# Patient Record
Sex: Male | Born: 1970
Health system: Southern US, Community
[De-identification: ages and names within clinical notes are randomized; demographics above are authoritative.]

## PROBLEM LIST (undated history)

## (undated) DIAGNOSIS — R001 Bradycardia, unspecified: Secondary | ICD-10-CM

## (undated) DIAGNOSIS — I471 Supraventricular tachycardia, unspecified: Secondary | ICD-10-CM

## (undated) DIAGNOSIS — R002 Palpitations: Secondary | ICD-10-CM

## (undated) DIAGNOSIS — C8590 Non-Hodgkin lymphoma, unspecified, unspecified site: Secondary | ICD-10-CM

## (undated) DIAGNOSIS — E7800 Pure hypercholesterolemia, unspecified: Secondary | ICD-10-CM

## (undated) DIAGNOSIS — I10 Essential (primary) hypertension: Secondary | ICD-10-CM

## (undated) HISTORY — DX: Pure hypercholesterolemia, unspecified: E78.00

## (undated) HISTORY — DX: Palpitations: R00.2

## (undated) HISTORY — PX: KNEE SURGERY: SHX244

## (undated) HISTORY — DX: Bradycardia, unspecified: R00.1

## (undated) HISTORY — PX: SHOULDER SURGERY: SHX246

## (undated) HISTORY — PX: DG GALL BLADDER: HXRAD326

## (undated) HISTORY — DX: Essential (primary) hypertension: I10

---

## 2015-07-20 ENCOUNTER — Encounter: Payer: Self-pay | Admitting: Cardiovascular Disease

## 2015-07-20 ENCOUNTER — Ambulatory Visit (INDEPENDENT_AMBULATORY_CARE_PROVIDER_SITE_OTHER): Payer: 59 | Admitting: Cardiovascular Disease

## 2015-07-20 VITALS — BP 136/84 | HR 56 | Ht 68.0 in | Wt 188.8 lb

## 2015-07-20 DIAGNOSIS — R002 Palpitations: Secondary | ICD-10-CM | POA: Diagnosis not present

## 2015-07-20 DIAGNOSIS — R001 Bradycardia, unspecified: Secondary | ICD-10-CM | POA: Diagnosis not present

## 2015-07-20 DIAGNOSIS — R079 Chest pain, unspecified: Secondary | ICD-10-CM | POA: Diagnosis not present

## 2015-07-20 HISTORY — DX: Bradycardia, unspecified: R00.1

## 2015-07-20 HISTORY — DX: Palpitations: R00.2

## 2015-07-20 MED ORDER — LOVASTATIN 20 MG PO TABS: 20.0000 mg | ORAL_TABLET | Freq: Every day | ORAL | Status: DC

## 2015-07-20 NOTE — Progress Notes (Signed)
Cardiology Office Note   Date:  07/20/2015   ID:  Alex Mendoza, DOB October 12, 1970, MRN [id number]  PCP:  Orpah Melter, MD  Cardiologist:   Skeet Latch, MD   Chief Complaint  Patient presents with  . New Evaluation    Self-Referred fro Dx: Irregular Heartbeat per Tama High, MD  pt states he went to ED in December for Arrhythmia--HR was 110, 2 episodes--has noticed irregular rhythm, fluctuating/erratic BP which is unusual; Anxiety; One night, took a full quinapril which he had done before, but this time it made his BP drop and he became very dizzy; no other Sx.     History of Present Illness: Alex Mendoza is a 45 y.o. male with hypertension and hyperlipidemia who presents for palpitations.  He reports two episodes of irregular heart rates.  The first occurred a few days after Christmas.  He went to the ED but the episode subsided prior to being seen.  It lasted for over an hour.  His heart felt irregular and the rate ranged between 100-115.   The second episode occurred one month ago and lasted for several hours.  His BP and heart rate were both elevated.  There was no associated lightheadedness or shortness of breath.  He decided to take an extra quinipril and then developed lightheadedness and near syncope.  Alex Mendoza does not drink much caffeine.  He did have a small amount of chocolate prior to the first episode.  He does not use over the counter cold or cough medication.    Alex Mendoza exercises on the elliptical regularly since 2010.  In general he does not have exertional chest pain.  A few months ago he tried to exercise more aggressively and noted some chest pain.  It subsided after he rested.  He has a healthy diet.  His cholesterol has been elevated in the past so he is managing it with diet, exercise, niacin and lovastatin.  He denies lower extremity edema, orthopnea or PND.  Alex Mendoza mother was diagnosed with atrial fibrillation in her 56s or 57s.      Past Medical History  Diagnosis Date  . Bradycardia 07/20/2015  . Palpitations 07/20/2015    No past surgical history on file.   Current Outpatient Prescriptions  Medication Sig Dispense Refill  . niacin 100 MG tablet Take 100 mg by mouth daily.    . quinapril (ACCUPRIL) 20 MG tablet Take 10 mg by mouth every morning.    . lovastatin (MEVACOR) 20 MG tablet Take 1 tablet (20 mg total) by mouth at bedtime.     No current facility-administered medications for this visit.    Allergies:   Sulfa antibiotics    Social History:  The patient  reports that he has never smoked. He does not have any smokeless tobacco history on file. He reports that he does not drink alcohol or use illicit drugs.   Family History:  The patient's family history includes Atrial fibrillation in his mother; Bradycardia in his father; Heart failure in his maternal grandmother and paternal grandmother.    ROS:  Please see the history of present illness.   Otherwise, review of systems are positive for none.   All other systems are reviewed and negative.    PHYSICAL EXAM: VS:  BP 136/84 mmHg  Pulse 56  Ht 5\' 8"  (1.727 m)  Wt 85.639 kg (188 lb 12.8 oz)  BMI 28.71 kg/m2 , BMI Body mass index is 28.71 kg/(m^2). GENERAL:  Well appearing HEENT:  Pupils equal round and reactive, fundi not visualized, oral mucosa unremarkable NECK:  No jugular venous distention, waveform within normal limits, carotid upstroke brisk and symmetric, no bruits, no thyromegaly LYMPHATICS:  No cervical adenopathy LUNGS:  Clear to auscultation bilaterally HEART:  Bradycardic.  Regular rhythm.  PMI not displaced or sustained,S1 and S2 within normal limits, no S3, no S4, no clicks, no rubs, no murmurs ABD:  Flat, positive bowel sounds normal in frequency in pitch, no bruits, no rebound, no guarding, no midline pulsatile mass, no hepatomegaly, no splenomegaly EXT:  2 plus pulses throughout, no edema, no cyanosis no clubbing SKIN:  No  rashes no nodules NEURO:  Cranial nerves II through XII grossly intact, motor grossly intact throughout PSYCH:  Cognitively intact, oriented to person place and time   EKG:  EKG is ordered today. The ekg ordered today demonstrates Sinus bradycardia. Rate 56 bpm.   Recent Labs: No results found for requested labs within last 365 days.    Lipid Panel No results found for: CHOL, TRIG, HDL, CHOLHDL, VLDL, LDLCALC, LDLDIRECT    Wt Readings from Last 3 Encounters:  07/20/15 85.639 kg (188 lb 12.8 oz)      ASSESSMENT AND PLAN:  # Palpitations:  Mr. Orders had two episode of sustained arrhythmia.  His description is concerning for atrial fibrillation.  Given that the episodes are very intermittent, we are unlikely to catch it on an ambulatory monitor.  I asked him to call us if it recurs and hopefully we can catch it on a monitor or EKG.  If it were atrial fibrillation, his This patients CHA2DS2-VASc Score would be 1.  Therefore, we will not consider starting anticoagulation.  His bradycardia prohibits the use of nodal agents.  He already had the appropriate laboratory testing which was unremarkable.   # Chest pain: Alex Mendoza had exertional chest pain when he tried to increase his exercise regimen.  In general he is in very good shape.  However, it is possible that this represents ischemia.  We will obtain an ETT to better evaluate.   Current medicines are reviewed at length with the patient today.  The patient does not have concerns regarding medicines.  The following changes have been made:  no change  Labs/ tests ordered today include:   Orders Placed This Encounter  Procedures  . Exercise Tolerance Test  . EKG 12-Lead    Disposition:   FU with Madilyn Cephas C. Oval Linsey, MD, [facility] as needed.   This note was written with the assistance of speech recognition software.  Please excuse any transcriptional errors.  Signed, Cindi Ghazarian C. Oval Linsey, MD, [facility]  07/20/2015 10:25 PM    [place]

## 2015-07-20 NOTE — Patient Instructions (Signed)
Medication Instructions:  Your physician recommends that you continue on your current medications as directed. Please refer to the Current Medication list given to you today.   Labwork: none  Testing/Procedures: Your physician has requested that you have an exercise tolerance test. For further information please visit www.cardiosmart.org. Please also follow instruction sheet, as given.     Follow-Up: As needed.       If you need a refill on your cardiac medications before your next appointment, please call your pharmacy.   

## 2015-08-18 ENCOUNTER — Telehealth (HOSPITAL_COMMUNITY): Payer: Self-pay

## 2015-08-18 NOTE — Telephone Encounter (Signed)
Encounter complete. 

## 2015-08-23 ENCOUNTER — Inpatient Hospital Stay (HOSPITAL_COMMUNITY): Admission: RE | Admit: 2015-08-23 | Payer: 59 | Source: Ambulatory Visit

## 2015-08-25 ENCOUNTER — Telehealth (HOSPITAL_COMMUNITY): Payer: Self-pay

## 2015-08-25 NOTE — Telephone Encounter (Signed)
Encounter complete. 

## 2015-08-30 ENCOUNTER — Ambulatory Visit (HOSPITAL_COMMUNITY)
Admission: RE | Admit: 2015-08-30 | Discharge: 2015-08-30 | Disposition: A | Discharge status: Home / Self Care | Payer: 59 | Source: Ambulatory Visit | Attending: Cardiovascular Disease | Admitting: Cardiovascular Disease

## 2015-08-30 DIAGNOSIS — R079 Chest pain, unspecified: Secondary | ICD-10-CM | POA: Insufficient documentation

## 2015-08-30 LAB — EXERCISE TOLERANCE TEST
CHL CUP MPHR: 174 {beats}/min
CHL CUP RESTING HR STRESS: 67 {beats}/min
CHL RATE OF PERCEIVED EXERTION: 17
CSEPEDS: 1 s
CSEPHR: 99 %
Estimated workload: 17.5 METS
Exercise duration (min): 15 min
Peak HR: 173 {beats}/min

## 2016-03-08 ENCOUNTER — Encounter (INDEPENDENT_AMBULATORY_CARE_PROVIDER_SITE_OTHER): Payer: Self-pay

## 2016-03-08 ENCOUNTER — Ambulatory Visit (INDEPENDENT_AMBULATORY_CARE_PROVIDER_SITE_OTHER): Payer: 59

## 2016-03-08 ENCOUNTER — Ambulatory Visit (INDEPENDENT_AMBULATORY_CARE_PROVIDER_SITE_OTHER): Payer: 59 | Admitting: Orthopaedic Surgery

## 2016-03-08 ENCOUNTER — Encounter (INDEPENDENT_AMBULATORY_CARE_PROVIDER_SITE_OTHER): Payer: Self-pay | Admitting: Orthopaedic Surgery

## 2016-03-08 DIAGNOSIS — M25522 Pain in left elbow: Secondary | ICD-10-CM | POA: Diagnosis not present

## 2016-03-08 NOTE — Progress Notes (Signed)
Office Visit Note   Patient: Alex Mendoza           Date of Birth: July 05, 1970           MRN: [id number] Visit Date: 03/08/2016              Requested by: Orpah Melter, MD [address] [place], [place] [address] PCP: Orpah Melter, MD   Assessment & Plan: Visit Diagnoses:  1. Pain in left elbow     Plan: From my standpoint patient is doing quite well from his radial head resection. I think his weakness is more related to statin use. I do think that from his injury and surgery he does have some partial permanent impairment that would render him slightly weak compared to his unaffected side but from what he is in dorsi and sounds more like it's related to statin use. I encouraged him to discuss with his primary care doctor about his statin use. I will see him back as needed.  Follow-Up Instructions: Return if symptoms worsen or fail to improve.   Orders:  Orders Placed This Encounter  Procedures  . XR Elbow 2 Views Left   No orders of the defined types were placed in this encounter.     Procedures: No procedures performed   Clinical Data: No additional findings.   Subjective: Chief Complaint  Patient presents with  . Left Elbow - Weakness    Patient states left radius fracture in 1985. Left forearm has always been weak. Pain and weakness increased recently after taking statin, patient has stopped taking statin and symptoms have decreased.    Patient is a 45 year old gentleman with nonspecific left arm and elbow weakness since 1985. He has a history of left radial head fracture that was treated operatively with radial head resection. He did notice that he had a form elbow weakness or he was taking statins and the weakness did improve once he came off of those medicines. He denies any focal motor or sensory deficits of his left upper extremity. He feels like some things or heavy when he lifts them. He denies any point tenderness at the elbow or radial head  region. He has good range of motion.    Review of Systems Complete review of systems is negative except for history of present illness  Objective: Vital Signs: There were no vitals taken for this visit.  Physical Exam Well-developed well-nourished no acute distress alert 3 nonlabored breathing normal judgments affect abdomen soft no lymphadenopathy Ortho Exam Exam of the left elbow shows a postsurgical scar that is well-healed. He has excellent range of motion in all planes. The radiocapitellar joint is nontender. Lateral epicondyles nontender. Negative cubital tunnel symptoms. Specialty Comments:  No specialty comments available.  Imaging: Xr Elbow 2 Views Left  Result Date: 03/08/2016 Status post radial head resection. No acute findings. Elbow joint is located.    PMFS History: Patient Active Problem List   Diagnosis Date Noted  . Pain in left elbow 03/08/2016  . Bradycardia 07/20/2015  . Palpitations 07/20/2015   Past Medical History:  Diagnosis Date  . Bradycardia 07/20/2015  . Palpitations 07/20/2015    Family History  Problem Relation Age of Onset  . Atrial fibrillation Mother   . Heart failure Maternal Grandmother   . Heart failure Paternal Grandmother   . Bradycardia Father     History reviewed. No pertinent surgical history. Social History   Occupational History  . Not on file.   Social  History Main Topics  . Smoking status: Never Smoker  . Smokeless tobacco: Not on file  . Alcohol use No  . Drug use: No  . Sexual activity: Not on file

## 2016-03-18 DIAGNOSIS — R51 Headache: Secondary | ICD-10-CM | POA: Diagnosis not present

## 2016-03-18 DIAGNOSIS — I10 Essential (primary) hypertension: Secondary | ICD-10-CM | POA: Diagnosis not present

## 2016-03-18 DIAGNOSIS — E7800 Pure hypercholesterolemia, unspecified: Secondary | ICD-10-CM | POA: Diagnosis not present

## 2016-04-23 ENCOUNTER — Ambulatory Visit (INDEPENDENT_AMBULATORY_CARE_PROVIDER_SITE_OTHER): Payer: 59 | Admitting: Diagnostic Neuroimaging

## 2016-04-23 ENCOUNTER — Encounter: Payer: Self-pay | Admitting: Diagnostic Neuroimaging

## 2016-04-23 VITALS — BP 128/85 | HR 68 | Ht 67.0 in | Wt 194.2 lb

## 2016-04-23 DIAGNOSIS — R51 Headache: Secondary | ICD-10-CM

## 2016-04-23 DIAGNOSIS — R519 Headache, unspecified: Secondary | ICD-10-CM

## 2016-04-23 NOTE — Progress Notes (Signed)
[facility]  PATIENT: Alex Mendoza DOB: 02-May-1970  REFERRING CLINICIAN: Olen Pel, S HISTORY FROM: patient  REASON FOR VISIT: new consult    HISTORICAL  CHIEF COMPLAINT:  Chief Complaint  Patient presents with  . Chronic headaches    rm 7, New Pt, "headaches off and on, may be related to anxiety/stress; worse in past 2 wks; numbness in face/forehead; feeling of fatigue in my brain; ASA as needed w/varied relief"    HISTORY OF PRESENT ILLNESS:   46 year old male here for evaluation of headaches.  Patient reports at least 6 months of increasing frontal, tension, pulling headaches. He has had intermittent nausea and sensitivity to light with these headaches. Headaches have been particularly worse in the past few weeks and occurring almost on a daily basis. Sometimes she has problems with focus and attention during these headaches. He feels something is "wrong" in his face or head. He has looked up symptoms online is concerned about a variety of neurologic problem such as multiple sclerosis.  Patient has history of headaches as a child with unilateral or bilateral severe pounding headaches, with nausea and photophobia. He describes these as "migraine" headaches. He was never officially diagnosed. He had missed school several times when he was younger due to these headaches. No specific triggering factors at that time. Headaches can last hours at a time.   Patient has been having more stress and tension at work. Patient has noticed that dark chocolate and certain types of potato chips can trigger headaches.  No family history of migraine or headaches.  Patient does have history of anxiety and tension, but has not been on medication.   REVIEW OF SYSTEMS: Full 14 system review of systems performed and negative with exception of: Fatigue snoring headache numbness weakness dizziness anxiety.  ALLERGIES: Allergies  Allergen Reactions  . Sulfa Antibiotics Hives  and Rash    HOME MEDICATIONS: Outpatient Medications Prior to Visit  Medication Sig Dispense Refill  . quinapril (ACCUPRIL) 20 MG tablet Take 10 mg by mouth every morning.    . lovastatin (MEVACOR) 20 MG tablet Take 1 tablet (20 mg total) by mouth at bedtime.    . niacin 100 MG tablet Take 100 mg by mouth daily.     No facility-administered medications prior to visit.     PAST MEDICAL HISTORY: Past Medical History:  Diagnosis Date  . Bradycardia 07/20/2015  . Hypercholesteremia   . Hypertension   . Palpitations 07/20/2015    PAST SURGICAL HISTORY: Past Surgical History:  Procedure Laterality Date  . DG GALL BLADDER    . KNEE SURGERY Right   . SHOULDER SURGERY Left     FAMILY HISTORY: Family History  Problem Relation Age of Onset  . Atrial fibrillation Mother   . Heart failure Maternal Grandmother   . Heart failure Paternal Grandmother   . Bradycardia Father   . Cancer Paternal Aunt     SOCIAL HISTORY:  Social History   Social History  . Marital status: Married    Spouse name: Margaretha Sheffield  . Number of children: 2  . Years of education: 16   Occupational History  .      [employer]   Social History Main Topics  . Smoking status: Never Smoker  . Smokeless tobacco: Never Used  . Alcohol use No  . Drug use: No  . Sexual activity: Not on file   Other Topics Concern  . Not on file   Social History Narrative   Lives with wife,  family   Caffeine- some chocolate     PHYSICAL EXAM  GENERAL EXAM/CONSTITUTIONAL: Vitals:  Vitals:   04/23/16 1251  BP: 128/85  Pulse: 68  Weight: 194 lb 3.2 oz (88.1 kg)  Height: 5\' 7"  (1.702 m)     Body mass index is 30.42 kg/m.  Visual Acuity Screening   Right eye Left eye Both eyes  Without correction:     With correction: 20/30 20/20      Patient is in no distress; well developed, nourished and groomed; neck is supple  CARDIOVASCULAR:  Examination of carotid arteries is normal; no carotid bruits  Regular rate and  rhythm, no murmurs  Examination of peripheral vascular system by observation and palpation is normal  EYES:  Ophthalmoscopic exam of optic discs and posterior segments is normal; no papilledema or hemorrhages  MUSCULOSKELETAL:  Gait, strength, tone, movements noted in Neurologic exam below  NEUROLOGIC: MENTAL STATUS:  No flowsheet data found.  awake, alert, oriented to person, place and time  recent and remote memory intact  normal attention and concentration  language fluent, comprehension intact, naming intact,   fund of knowledge appropriate  CRANIAL NERVE:   2nd - no papilledema on fundoscopic exam  2nd, 3rd, 4th, 6th - pupils equal and reactive to light, visual fields full to confrontation, extraocular muscles intact, no nystagmus  5th - facial sensation symmetric  7th - facial strength symmetric  8th - hearing intact  9th - palate elevates symmetrically, uvula midline  11th - shoulder shrug symmetric  12th - tongue protrusion midline  MOTOR:   normal bulk and tone, full strength in the BUE, BLE  SENSORY:   normal and symmetric to light touch, pinprick, temperature, vibration  COORDINATION:   finger-nose-finger, fine finger movements normal  REFLEXES:   deep tendon reflexes present and symmetric  GAIT/STATION:   narrow based gait; able to walk on toes, heels and tandem; romberg is negative    DIAGNOSTIC DATA (LABS, IMAGING, TESTING) - I reviewed patient records, labs, notes, testing and imaging myself where available.  No results found for: WBC, HGB, HCT, MCV, PLT No results found for: NA, K, CL, CO2, GLUCOSE, BUN, CREATININE, CALCIUM, PROT, ALBUMIN, AST, ALT, ALKPHOS, BILITOT, GFRNONAA, GFRAA No results found for: CHOL, HDL, LDLCALC, LDLDIRECT, TRIG, CHOLHDL No results found for: HGBA1C No results found for: VITAMINB12 No results found for: TSH      ASSESSMENT AND PLAN  46 y.o. year old male here with history of migraine  headaches since childhood, now with new type of headache in the past 6 months, especially worse in the past few weeks. Patient also under significant stress and tension related to his work. Also has history of anxiety. We'll check MRI of the brain to rule out any secondary cause due to severe and new onset of headache.   Dx: migraine + tension HA; vs secondary headache   1. New onset headache      PLAN: - check MRI brain (rule out mass, infx, inflammation) - consider amitriptyline for migraine prevention in future  Orders Placed This Encounter  Procedures  . MR BRAIN W WO CONTRAST   Return in about 6 weeks (around 06/04/2016).  I reviewed images, labs, notes, records myself. I summarized findings and reviewed with patient, for this high risk condition (new onset headache) requiring high complexity decision making.     Penni Bombard, MD [id number], 0000000 PM Certified in Neurology, Neurophysiology and Neuroimaging  [facility] [address]  [place], [place] [address] [phone]

## 2016-04-23 NOTE — Patient Instructions (Signed)
Thank you for coming to see [place] at [facility]. I hope we have been able to provide you high quality care today.  You may receive a patient satisfaction survey over the next few weeks. We would appreciate your feedback and comments so that we may continue to improve ourselves and the health of our patients.  - I will check MRI brain   - To prevent or relieve headaches, try the following:   Cool Compress. Lie down and place a cool compress on your head.   Avoid headache triggers. If certain foods or odors seem to have triggered your migraines in the past, avoid them. A headache diary might help you identify triggers.   Include physical activity in your daily routine.   Manage stress. Find healthy ways to cope with the stressors, such as delegating tasks on your to-do list.   Practice relaxation techniques. Try deep breathing, yoga, massage and visualization.   Eat regularly. Eating regularly scheduled meals and maintaining a healthy diet might help prevent headaches. Also, drink plenty of fluids.   Follow a regular sleep schedule. Sleep deprivation might contribute to headaches  Consider biofeedback. With this mind-body technique, you learn to control certain bodily functions - such as muscle tension, heart rate and blood pressure - to prevent headaches or reduce headache pain.    ~~~~~~~~~~~~~~~~~~~~~~~~~~~~~~~~~~~~~~~~~~~~~~~~~~~~~~~~~~~~~~~~~  DR. Yamileth Hayse'S GUIDE TO HAPPY AND HEALTHY LIVING These are some of my general health and wellness recommendations. Some of them may apply to you better than others. Please use common sense as you try these suggestions and feel free to ask me any questions.   ACTIVITY/FITNESS Mental, social, emotional and physical stimulation are very important for brain and body health. Try learning a new activity (arts, music, language, sports, games).  Keep moving your body to the best of your abilities. You can do this at home,  inside or outside, the park, community center, gym or anywhere you like. Consider a physical therapist or personal trainer to get started. Consider the app Sworkit. Fitness trackers such as smart-watches, smart-phones or Fitbits can help as well.   NUTRITION Eat more plants: colorful vegetables, nuts, seeds and berries.  Eat less sugar, salt, preservatives and processed foods.  Avoid toxins such as cigarettes and alcohol.  Drink water when you are thirsty. Warm water with a slice of lemon is an excellent morning drink to start the day.  Consider these websites for more information The Nutrition Source (https://www.henry-hernandez.biz/) Precision Nutrition (WindowBlog.ch)   RELAXATION Consider practicing mindfulness meditation or other relaxation techniques such as deep breathing, prayer, yoga, tai chi, massage. See website mindful.org or the apps Headspace or Calm to help get started.   SLEEP Try to get at least 7-8+ hours sleep per day. Regular exercise and reduced caffeine will help you sleep better. Practice good sleep hygeine techniques. See website sleep.org for more information.   PLANNING Prepare estate planning, living will, healthcare POA documents. Sometimes this is best planned with the help of an attorney. Theconversationproject.org and agingwithdignity.org are excellent resources.

## 2016-04-24 ENCOUNTER — Other Ambulatory Visit: Payer: Self-pay | Admitting: Diagnostic Neuroimaging

## 2016-04-24 DIAGNOSIS — R51 Headache: Principal | ICD-10-CM

## 2016-04-24 DIAGNOSIS — R519 Headache, unspecified: Secondary | ICD-10-CM

## 2016-04-27 ENCOUNTER — Other Ambulatory Visit (HOSPITAL_BASED_OUTPATIENT_CLINIC_OR_DEPARTMENT_OTHER): Payer: 59

## 2016-05-01 ENCOUNTER — Ambulatory Visit (INDEPENDENT_AMBULATORY_CARE_PROVIDER_SITE_OTHER): Payer: 59

## 2016-05-01 DIAGNOSIS — R51 Headache: Secondary | ICD-10-CM

## 2016-05-01 DIAGNOSIS — R519 Headache, unspecified: Secondary | ICD-10-CM

## 2016-05-01 MED ORDER — GADOPENTETATE DIMEGLUMINE 469.01 MG/ML IV SOLN: 18.0000 mL | Freq: Once | INTRAVENOUS | Status: AC | PRN

## 2016-05-03 ENCOUNTER — Telehealth: Payer: Self-pay | Admitting: *Deleted

## 2016-05-03 NOTE — Telephone Encounter (Signed)
Per Dr Leta Baptist, spoke with  Patient and advised him his MRI brain results are unremarkable. Advised he monitor his headaches, consider migraine prevention medication, call prior to FU for any needs, concerns. He verbalized understanding, appreciation.

## 2016-05-07 ENCOUNTER — Ambulatory Visit: Payer: 59 | Admitting: Diagnostic Neuroimaging

## 2016-05-15 ENCOUNTER — Telehealth: Payer: Self-pay | Admitting: Diagnostic Neuroimaging

## 2016-05-15 NOTE — Telephone Encounter (Signed)
Pt chose to cx 4/2 and not r/s another appt. Pt states he is not interested in r/s at this time.

## 2016-05-15 NOTE — Telephone Encounter (Signed)
Noted  

## 2016-06-10 ENCOUNTER — Ambulatory Visit: Payer: 59 | Admitting: Diagnostic Neuroimaging

## 2016-12-23 DIAGNOSIS — M9901 Segmental and somatic dysfunction of cervical region: Secondary | ICD-10-CM | POA: Diagnosis not present

## 2016-12-23 DIAGNOSIS — M5033 Other cervical disc degeneration, cervicothoracic region: Secondary | ICD-10-CM | POA: Diagnosis not present

## 2016-12-23 DIAGNOSIS — M531 Cervicobrachial syndrome: Secondary | ICD-10-CM | POA: Diagnosis not present

## 2016-12-24 DIAGNOSIS — M5033 Other cervical disc degeneration, cervicothoracic region: Secondary | ICD-10-CM | POA: Diagnosis not present

## 2016-12-24 DIAGNOSIS — M531 Cervicobrachial syndrome: Secondary | ICD-10-CM | POA: Diagnosis not present

## 2016-12-24 DIAGNOSIS — M9901 Segmental and somatic dysfunction of cervical region: Secondary | ICD-10-CM | POA: Diagnosis not present

## 2016-12-27 DIAGNOSIS — M9901 Segmental and somatic dysfunction of cervical region: Secondary | ICD-10-CM | POA: Diagnosis not present

## 2016-12-27 DIAGNOSIS — M531 Cervicobrachial syndrome: Secondary | ICD-10-CM | POA: Diagnosis not present

## 2016-12-27 DIAGNOSIS — M5033 Other cervical disc degeneration, cervicothoracic region: Secondary | ICD-10-CM | POA: Diagnosis not present

## 2017-01-23 DIAGNOSIS — Z131 Encounter for screening for diabetes mellitus: Secondary | ICD-10-CM | POA: Diagnosis not present

## 2017-01-29 DIAGNOSIS — M9901 Segmental and somatic dysfunction of cervical region: Secondary | ICD-10-CM | POA: Diagnosis not present

## 2017-01-29 DIAGNOSIS — M5033 Other cervical disc degeneration, cervicothoracic region: Secondary | ICD-10-CM | POA: Diagnosis not present

## 2017-01-29 DIAGNOSIS — M531 Cervicobrachial syndrome: Secondary | ICD-10-CM | POA: Diagnosis not present

## 2017-10-29 DIAGNOSIS — Z0000 Encounter for general adult medical examination without abnormal findings: Secondary | ICD-10-CM | POA: Diagnosis not present

## 2017-10-30 ENCOUNTER — Other Ambulatory Visit: Payer: Self-pay | Admitting: Adult Health Nurse Practitioner

## 2017-10-30 DIAGNOSIS — N6320 Unspecified lump in the left breast, unspecified quadrant: Secondary | ICD-10-CM

## 2017-10-31 ENCOUNTER — Ambulatory Visit
Admission: RE | Admit: 2017-10-31 | Discharge: 2017-10-31 | Disposition: A | Discharge status: Home / Self Care | Payer: 59 | Source: Ambulatory Visit | Attending: Adult Health Nurse Practitioner | Admitting: Adult Health Nurse Practitioner

## 2017-10-31 ENCOUNTER — Other Ambulatory Visit: Payer: Self-pay | Admitting: Adult Health Nurse Practitioner

## 2017-10-31 DIAGNOSIS — R590 Localized enlarged lymph nodes: Secondary | ICD-10-CM | POA: Diagnosis not present

## 2017-10-31 DIAGNOSIS — N6321 Unspecified lump in the left breast, upper outer quadrant: Secondary | ICD-10-CM | POA: Diagnosis not present

## 2017-10-31 DIAGNOSIS — R599 Enlarged lymph nodes, unspecified: Secondary | ICD-10-CM

## 2017-10-31 DIAGNOSIS — N6320 Unspecified lump in the left breast, unspecified quadrant: Secondary | ICD-10-CM

## 2017-10-31 DIAGNOSIS — C8334 Diffuse large B-cell lymphoma, lymph nodes of axilla and upper limb: Secondary | ICD-10-CM | POA: Diagnosis not present

## 2017-10-31 DIAGNOSIS — R928 Other abnormal and inconclusive findings on diagnostic imaging of breast: Secondary | ICD-10-CM | POA: Diagnosis not present

## 2017-10-31 DIAGNOSIS — C8339 Diffuse large B-cell lymphoma, extranodal and solid organ sites: Secondary | ICD-10-CM | POA: Diagnosis not present

## 2017-11-07 ENCOUNTER — Telehealth: Payer: Self-pay | Admitting: Hematology

## 2017-11-07 NOTE — Telephone Encounter (Signed)
Pt has been scheduled to see Dr. Irene Limbo on 9/4 at 340pm. Pt aware to arrive 30 minutes early. Pt has requested to have a 2nd opinion with Dr. Arelia Sneddon as well. Will notify the [place] location. Letter mailed to the pt.

## 2017-11-12 ENCOUNTER — Inpatient Hospital Stay: Payer: 59 | Attending: Hematology | Admitting: Hematology

## 2017-11-12 ENCOUNTER — Encounter: Payer: Self-pay | Admitting: Hematology

## 2017-11-12 VITALS — BP 135/96 | HR 75 | Temp 98.1°F | Resp 18 | Ht 67.0 in | Wt 193.8 lb

## 2017-11-12 DIAGNOSIS — Z79899 Other long term (current) drug therapy: Secondary | ICD-10-CM

## 2017-11-12 DIAGNOSIS — R600 Localized edema: Secondary | ICD-10-CM

## 2017-11-12 DIAGNOSIS — I10 Essential (primary) hypertension: Secondary | ICD-10-CM | POA: Diagnosis not present

## 2017-11-12 DIAGNOSIS — C8338 Diffuse large B-cell lymphoma, lymph nodes of multiple sites: Secondary | ICD-10-CM | POA: Diagnosis present

## 2017-11-12 DIAGNOSIS — Z5112 Encounter for antineoplastic immunotherapy: Secondary | ICD-10-CM | POA: Diagnosis not present

## 2017-11-12 DIAGNOSIS — R200 Anesthesia of skin: Secondary | ICD-10-CM | POA: Diagnosis not present

## 2017-11-12 DIAGNOSIS — D3501 Benign neoplasm of right adrenal gland: Secondary | ICD-10-CM | POA: Diagnosis not present

## 2017-11-12 DIAGNOSIS — Z23 Encounter for immunization: Secondary | ICD-10-CM | POA: Diagnosis not present

## 2017-11-12 DIAGNOSIS — F419 Anxiety disorder, unspecified: Secondary | ICD-10-CM | POA: Diagnosis not present

## 2017-11-12 DIAGNOSIS — E7800 Pure hypercholesterolemia, unspecified: Secondary | ICD-10-CM | POA: Diagnosis not present

## 2017-11-12 DIAGNOSIS — Z5111 Encounter for antineoplastic chemotherapy: Secondary | ICD-10-CM | POA: Insufficient documentation

## 2017-11-12 DIAGNOSIS — R51 Headache: Secondary | ICD-10-CM | POA: Diagnosis not present

## 2017-11-12 DIAGNOSIS — R222 Localized swelling, mass and lump, trunk: Secondary | ICD-10-CM

## 2017-11-12 NOTE — Progress Notes (Signed)
HEMATOLOGY/ONCOLOGY CONSULTATION NOTE  Date of Service: 11/12/2017  Patient Care Team: Orpah Melter, MD as PCP - General (Family Medicine)  CHIEF COMPLAINTS/PURPOSE OF CONSULTATION:  Newly diagnosed Diffuse Large B-Cell Lymphoma   HISTORY OF PRESENTING ILLNESS:   Alex Mendoza is a wonderful 47 y.o. male who has been referred to [place] by Alesia Morin, NP for evaluation and management of Diffuse Large B-Cell Lymphoma. He is accompanied today by his wife. The pt reports that he is doing well overall.   The pt presented to care with his PCP Alesia Morin on 10/29/17 complaining of a swollen left armpit and a lump on left pectoral area that had not changed in 12 months. He first noted the left armpit 3 weeks ago, which he notes is growing quickly.   The pt reports that he occasionally gets headaches that present with some numbness and other feelings that he notes are difficult to describe. He notes that these headaches have worsened over the last 6 months and have caused him to miss work. He notes that these headaches seem to concentrate in the center of his forehead and he has needed to use up to 4-6 aspirin. He notes that his headaches have been evaluated in January 2018 with a MRI Brain which was normal.   The pt notes that he did have a deer tick bite in July 2017 and received prophylactic antibiotics.   The pt notes that he stays very active and eats healthy in general. He notes that he has anxiety and tries to mitigate his anxiety with cardio and staying active. He also recently began Buspirone and notes that he has been sleeping better in the last week.   Of note prior to the patient's visit today, pt has had Left breast nodule and Left axillary biopsies completed on 10/31/17 with results revealing Diffuse Large B-Cell Lymphoma.   Most recent lab results (10/29/17) of CBC is as follows: all values are WNL except for ANC at 8.5k.   On review of systems, pt reports staying active,  worsened headaches over 6 months, good energy levels, some anxiety, and denies skin rashes, drenching night sweats, fevers, chills, unexpected weight loss, abdominal pains, SOB, chest discomfort, leg swelling, joint pain and swelling, and any other symptoms.   On PMHx the pt reports borderline hypertension, hyperlipidemia, occasional somnambulism, choleycystectomy in 2007, left knee surgery. He denies sleep apnea.  On Social Hx the pt denies any ETOH consumption and denies ever smoking.  On Family Hx the pt reports paternal aunt with brain tumor, and denies blood or cancer disorders or CAD.  He reports sulfa antibiotic allergies with whole body rash.    MEDICAL HISTORY:  Past Medical History:  Diagnosis Date  . Bradycardia 07/20/2015  . Hypercholesteremia   . Hypertension   . Palpitations 07/20/2015    SURGICAL HISTORY: Past Surgical History:  Procedure Laterality Date  . DG GALL BLADDER    . KNEE SURGERY Right   . SHOULDER SURGERY Left     SOCIAL HISTORY: Social History   Socioeconomic History  . Marital status: Married    Spouse name: Margaretha Sheffield  . Number of children: 2  . Years of education: 68  . Highest education level: Not on file  Occupational History    Comment: [employer]  Social Needs  . Financial resource strain: Not on file  . Food insecurity:    Worry: Not on file    Inability: Not on file  . Transportation needs:  Medical: Not on file    Non-medical: Not on file  Tobacco Use  . Smoking status: Never Smoker  . Smokeless tobacco: Never Used  Substance and Sexual Activity  . Alcohol use: No    Alcohol/week: 0.0 standard drinks  . Drug use: No  . Sexual activity: Not on file  Lifestyle  . Physical activity:    Days per week: Not on file    Minutes per session: Not on file  . Stress: Not on file  Relationships  . Social connections:    Talks on phone: Not on file    Gets together: Not on file    Attends religious service: Not on file    Active member of  club or organization: Not on file    Attends meetings of clubs or organizations: Not on file    Relationship status: Not on file  . Intimate partner violence:    Fear of current or ex partner: Not on file    Emotionally abused: Not on file    Physically abused: Not on file    Forced sexual activity: Not on file  Other Topics Concern  . Not on file  Social History Narrative   Lives with wife, family   Caffeine- some chocolate    FAMILY HISTORY: Family History  Problem Relation Age of Onset  . Atrial fibrillation Mother   . Heart failure Maternal Grandmother   . Heart failure Paternal Grandmother   . Bradycardia Father   . Cancer Paternal Aunt     ALLERGIES:  is allergic to sulfa antibiotics.  MEDICATIONS:  Current Outpatient Medications  Medication Sig Dispense Refill  . busPIRone (BUSPAR) 5 MG tablet Take 5 mg by mouth 2 (two) times daily.    . rosuvastatin (CRESTOR) 10 MG tablet Take 10 mg by mouth daily.    . quinapril (ACCUPRIL) 20 MG tablet Take 10 mg by mouth every morning.     No current facility-administered medications for this visit.    Facility-Administered Medications Ordered in Other Visits  Medication Dose Route Frequency Provider Last Rate Last Dose  . gadopentetate dimeglumine (MAGNEVIST) injection 18 mL  18 mL Intravenous Once PRN Penumalli, Earlean Polka, MD        REVIEW OF SYSTEMS:    10 Point review of Systems was done is negative except as noted above.  PHYSICAL EXAMINATION: ECOG PERFORMANCE STATUS: 1 - Symptomatic but completely ambulatory  . Vitals:   11/12/17 1552  BP: (!) 135/96  Pulse: 75  Resp: 18  Temp: 98.1 F (36.7 C)  SpO2: 99%   Filed Weights   11/12/17 1552  Weight: 193 lb 12.8 oz (87.9 kg)   .Body mass index is 30.35 kg/m.  GENERAL:alert, in no acute distress and comfortable SKIN: no acute rashes, no significant lesions EYES: conjunctiva are pink and non-injected, sclera anicteric OROPHARYNX: MMM, no exudates, no  oropharyngeal erythema or ulceration NECK: supple, no JVD LYMPH:  4-5cm LN in left axilla, 2-3cm LN in left chest wall, no palpable lymphadenopathy in the cervical or inguinal regions LUNGS: clear to auscultation b/l with normal respiratory effort HEART: regular rate & rhythm ABDOMEN:  normoactive bowel sounds , non tender, not distended. Extremity: no pedal edema PSYCH: alert & oriented x 3 with fluent speech NEURO: no focal motor/sensory deficits  LABORATORY DATA:  I have reviewed the data as listed  .No flowsheet data found. Component     Latest Ref Rng & Units 11/14/2017  Sodium     135 -  145 mmol/L 142  Potassium     3.5 - 5.1 mmol/L 4.2  Chloride     98 - 111 mmol/L 108  CO2     22 - 32 mmol/L 24  Glucose     70 - 99 mg/dL 94  BUN     6 - 20 mg/dL 19  Creatinine     0.61 - 1.24 mg/dL 1.11  Calcium     8.9 - 10.3 mg/dL 9.4  Total Protein     6.5 - 8.1 g/dL 7.1  Albumin     3.5 - 5.0 g/dL 4.1  AST     15 - 41 U/L 51 (H)  ALT     0 - 44 U/L 50 (H)  Alkaline Phosphatase     38 - 126 U/L 57  Total Bilirubin     0.3 - 1.2 mg/dL 0.7  GFR, Est Non African American     >60 mL/min >60  GFR, Est African American     >60 mL/min >60  Anion gap     5 - 15 10  LDH     98 - 192 U/L 219 (H)  HCV Ab     0.0 - 0.9 s/co ratio 0.1  Hep B Core Ab, Tot     Negative Negative  Hepatitis B Surface Ag     Negative Negative  HIV Screen 4th Generation wRfx     Non Reactive Non Reactive     10/29/17 CBC w/diff and CMP:    10/31/17 Left Breast and Left Axilla Biopsies:    RADIOGRAPHIC STUDIES: I have personally reviewed the radiological images as listed and agreed with the findings in the report. US Breast Ltd Uni Left Inc Axilla  Result Date: 10/31/2017 CLINICAL DATA:  Patient presents with a palpable left breast mass, which he says has been present for between 1 and 2 years. More recently, patient has a palpable a lump in the left axilla. EXAM: DIGITAL DIAGNOSTIC  BILATERAL MAMMOGRAM WITH CAD AND TOMO ULTRASOUND LEFT BREAST COMPARISON:  None ACR Breast Density Category a: The breast tissue is almost entirely fatty. FINDINGS: The that there is an irregular mass in the upper outer left breast corresponding to the palpable abnormality in the. There is a smaller, adjacent irregular mass. No other breast masses. Mammographic images were processed with CAD. On physical exam, there is a firm palpable mass in the upper outer left breast. There is a soft palpable lump in the left axilla. Targeted ultrasound is performed, showing an irregular hypoechoic mass in the left breast at 12:30 o'clock, 7 cm the nipple, measuring 13 x 12 x 14 mm. The mass is vascular with prominent blood flow on color Doppler analysis. Peripheral to this, at the 1 o'clock position, 8 cm the nipple, there is a small irregular hypoechoic mass measuring 5 x 3 x 4 mm. In the left axilla, there are 3 discrete enlarged/abnormal lymph nodes. The largest node measures 4.3 x 1.9 x 3.5 cm. IMPRESSION: 1. Left breast mass in which is highly suspicious for breast malignancy. There is a smaller adjacent mass that is consistent with a satellite lesion. There are 3 discrete abnormal/enlarged left axillary lymph nodes consistent with metastatic disease. RECOMMENDATION: 1. Ultrasound-guided core needle biopsy of the primary left breast mass on the largest left axillary lymph nodes. This will be performed today. I have discussed the findings and recommendations with the patient. Results were also provided in writing at the conclusion of the visit. If applicable,  a reminder letter will be sent to the patient regarding the next appointment. BI-RADS CATEGORY  5: Highly suggestive of malignancy. Electronically Signed   By: Lajean Manes M.D.   On: 10/31/2017 12:31   Mm Diag Breast Tomo Bilateral  Result Date: 10/31/2017 CLINICAL DATA:  Patient presents with a palpable left breast mass, which he says has been present for between  1 and 2 years. More recently, patient has a palpable a lump in the left axilla. EXAM: DIGITAL DIAGNOSTIC BILATERAL MAMMOGRAM WITH CAD AND TOMO ULTRASOUND LEFT BREAST COMPARISON:  None ACR Breast Density Category a: The breast tissue is almost entirely fatty. FINDINGS: The that there is an irregular mass in the upper outer left breast corresponding to the palpable abnormality in the. There is a smaller, adjacent irregular mass. No other breast masses. Mammographic images were processed with CAD. On physical exam, there is a firm palpable mass in the upper outer left breast. There is a soft palpable lump in the left axilla. Targeted ultrasound is performed, showing an irregular hypoechoic mass in the left breast at 12:30 o'clock, 7 cm the nipple, measuring 13 x 12 x 14 mm. The mass is vascular with prominent blood flow on color Doppler analysis. Peripheral to this, at the 1 o'clock position, 8 cm the nipple, there is a small irregular hypoechoic mass measuring 5 x 3 x 4 mm. In the left axilla, there are 3 discrete enlarged/abnormal lymph nodes. The largest node measures 4.3 x 1.9 x 3.5 cm. IMPRESSION: 1. Left breast mass in which is highly suspicious for breast malignancy. There is a smaller adjacent mass that is consistent with a satellite lesion. There are 3 discrete abnormal/enlarged left axillary lymph nodes consistent with metastatic disease. RECOMMENDATION: 1. Ultrasound-guided core needle biopsy of the primary left breast mass on the largest left axillary lymph nodes. This will be performed today. I have discussed the findings and recommendations with the patient. Results were also provided in writing at the conclusion of the visit. If applicable, a reminder letter will be sent to the patient regarding the next appointment. BI-RADS CATEGORY  5: Highly suggestive of malignancy. Electronically Signed   By: Lajean Manes M.D.   On: 10/31/2017 12:31   [place] Axillary Node Core Biopsy Left  Addendum Date: 11/12/2017     ADDENDUM REPORT: 11/12/2017 07:20 ADDENDUM: A consultation has been arranged with Dr Sullivan Lone, Hematology Oncology, with [place], [place], for November 12, 2017. Pathology results reported by Roselind Messier, RN on 11/11/2017. Electronically Signed   By: Lajean Manes M.D.   On: 11/12/2017 07:20   Addendum Date: 11/06/2017   ADDENDUM REPORT: 11/06/2017 11:09 ADDENDUM: Pathology revealed DIFFUSE LARGE B-CELL LYMPHOMA of LEFT breast, upper outer quadrant, 12:30 o'clock. This was found to be concordant by Dr. Lajean Manes. Pathology revealed DIFFUSE LARGE B-CELL LYMPHOMA, of lymph node, left axilla. This was found to be concordant by Dr. Lajean Manes. Pathology results were discussed with the patient by telephone. The patient reported doing well after the biopsy with tenderness at the site. Post biopsy instructions and care were reviewed and questions were answered. The patient was encouraged to call [facility] for any additional concerns. I have contacted Isaiah Blakes, New Patient Scheduler, with [place], [place], [place], with referral information and for an appointment with Dr. Sullivan Lone, Oncologist. Seth Bake will contact the patient with an appointment date and time. Pathology results reported by Roselind Messier, RN on 11/06/2017. Electronically Signed  By: Lajean Manes M.D.   On: 11/06/2017 11:09   Result Date: 11/12/2017 CLINICAL DATA:  Patient presents ultrasound-guided core needle biopsy of an upper outer quadrant left breast mass and 1 of the 3 enlarged left axillary lymph nodes. EXAM: ULTRASOUND GUIDED LEFT BREAST CORE NEEDLE BIOPSY ULTRASOUND GUIDED LEFT LEFT AXILLARY LYMPH NODE CORE NEEDLE BIOPSY COMPARISON:  Current diagnostic mammograms and ultrasound. FINDINGS: I met with the patient and we discussed the procedure of ultrasound-guided biopsy, including benefits and alternatives. We discussed the high likelihood of a successful  procedure. We discussed the risks of the procedure, including infection, bleeding, tissue injury, clip migration, and inadequate sampling. Informed written consent was given. The usual time-out protocol was performed immediately prior to the procedure. Lesion #1: 12:30 oclock mass. Lesion quadrant: Upper outer quadrant Using sterile technique and 1% Lidocaine as local anesthetic, under direct ultrasound visualization, a 12 gauge spring-loaded device was used to perform biopsy of the 12:30 o'clock position left breast mass using an inferomedial approach. At the conclusion of the procedure a ribbon shaped tissue marker clip was deployed into the biopsy cavity. Lesion #2:  Left axillary lymph node Using sterile technique and 1% Lidocaine as local anesthetic, under direct ultrasound visualization, a 14 gauge spring-loaded device was used to perform biopsy of the largest of the 3 abnormal left axillary lymph nodes using an inferior approach. At the conclusion of the procedure a HydroMARK tissue marker clip was deployed into the biopsy cavity. Follow up 2 view mammogram was performed and dictated separately. IMPRESSION: Ultrasound guided biopsy of a left breast upper outer quadrant mass and an abnormal left axillary lymph node. No apparent complications. Electronically Signed: By: Lajean Manes M.D. On: 10/31/2017 13:16   Mm Clip Placement Left  Result Date: 10/31/2017 CLINICAL DATA:  Status post ultrasound-guided left breast core needle biopsy of left axillary lymph node biopsy EXAM: DIAGNOSTIC LEFT MAMMOGRAM POST ULTRASOUND BIOPSY COMPARISON:  Previous exam(s). FINDINGS: Mammographic images were obtained following ultrasound guided biopsy of an upper outer quadrant left breast mass and an enlarged left axillary lymph node. The ribbon shaped clip lies within the posterosuperior aspect of the breast mass. HydroMARK clip and the enlarged left axillary lymph node are beyond the mammographic feel dizzy view. The lymph  node clip was well seen sonographically deploying within the mass. IMPRESSION: Well-positioned ribbon shaped biopsy clip within the left breast upper outer quadrant mass. Final Assessment: Post Procedure Mammograms for Marker Placement Electronically Signed   By: Lajean Manes M.D.   On: 10/31/2017 13:24   [place] Lt Breast Bx W Loc Dev 1st Lesion Img Bx Spec US Guide  Addendum Date: 11/12/2017   ADDENDUM REPORT: 11/12/2017 07:20 ADDENDUM: A consultation has been arranged with Dr Sullivan Lone, Hematology Oncology, with [place], [place], for November 12, 2017. Pathology results reported by Roselind Messier, RN on 11/11/2017. Electronically Signed   By: Lajean Manes M.D.   On: 11/12/2017 07:20   Addendum Date: 11/06/2017   ADDENDUM REPORT: 11/06/2017 11:09 ADDENDUM: Pathology revealed DIFFUSE LARGE B-CELL LYMPHOMA of LEFT breast, upper outer quadrant, 12:30 o'clock. This was found to be concordant by Dr. Lajean Manes. Pathology revealed DIFFUSE LARGE B-CELL LYMPHOMA, of lymph node, left axilla. This was found to be concordant by Dr. Lajean Manes. Pathology results were discussed with the patient by telephone. The patient reported doing well after the biopsy with tenderness at the site. Post biopsy instructions and care were reviewed and questions were answered. The patient was  encouraged to call [facility] for any additional concerns. I have contacted Isaiah Blakes, New Patient Scheduler, with [facility], [place], [place], with referral information and for an appointment with Dr. Sullivan Lone, Oncologist. Seth Bake will contact the patient with an appointment date and time. Pathology results reported by Roselind Messier, RN on 11/06/2017. Electronically Signed   By: Lajean Manes M.D.   On: 11/06/2017 11:09   Result Date: 11/12/2017 CLINICAL DATA:  Patient presents ultrasound-guided core needle biopsy of an upper outer quadrant left breast mass and 1 of the 3 enlarged  left axillary lymph nodes. EXAM: ULTRASOUND GUIDED LEFT BREAST CORE NEEDLE BIOPSY ULTRASOUND GUIDED LEFT LEFT AXILLARY LYMPH NODE CORE NEEDLE BIOPSY COMPARISON:  Current diagnostic mammograms and ultrasound. FINDINGS: I met with the patient and we discussed the procedure of ultrasound-guided biopsy, including benefits and alternatives. We discussed the high likelihood of a successful procedure. We discussed the risks of the procedure, including infection, bleeding, tissue injury, clip migration, and inadequate sampling. Informed written consent was given. The usual time-out protocol was performed immediately prior to the procedure. Lesion #1: 12:30 oclock mass. Lesion quadrant: Upper outer quadrant Using sterile technique and 1% Lidocaine as local anesthetic, under direct ultrasound visualization, a 12 gauge spring-loaded device was used to perform biopsy of the 12:30 o'clock position left breast mass using an inferomedial approach. At the conclusion of the procedure a ribbon shaped tissue marker clip was deployed into the biopsy cavity. Lesion #2:  Left axillary lymph node Using sterile technique and 1% Lidocaine as local anesthetic, under direct ultrasound visualization, a 14 gauge spring-loaded device was used to perform biopsy of the largest of the 3 abnormal left axillary lymph nodes using an inferior approach. At the conclusion of the procedure a HydroMARK tissue marker clip was deployed into the biopsy cavity. Follow up 2 view mammogram was performed and dictated separately. IMPRESSION: Ultrasound guided biopsy of a left breast upper outer quadrant mass and an abnormal left axillary lymph node. No apparent complications. Electronically Signed: By: Lajean Manes M.D. On: 10/31/2017 13:16    ASSESSMENT & PLAN:  47 y.o. male with  1. Newly diagnosed Diffuse Large B-Cell Lymphoma Presenting with left breast lesion and left axillary Lnadenopathy PLAN -Discussed patient's most recent labs from 10/29/17, no  anemia, no thrombocytosis -Discussed the 10/31/17 Left breast and left axilla LN pathologies which revealed Diffuse Large B-Cell Lymphoma with germinal center subtype and Ki67 of 40% -Discussed that the patient's diagnosis, natural history, treatment approaches and prognosis based on staging. -Will order PET/CT for initial staging and evaluation.   -Will order blood tests today - baseline labs as noted below -Will order MRI Brain to r/o brain involvement with lymphoma considering new headaches -Will order ECHO for pre-chemotherapy evaluation -Will refer pt to IR for port placement  -Discussed the treatment recommendation of R-CHOP and will consider G-CSF support if necessary  -Recommended that the pt continue to eat well, drink at least 48-64 oz of water each day, and walk 20-30 minutes each day.  -Discussed the recommendation to avoid crowds and avoid individuals with infections while receiving chemotherapy -Will set pt up for chemotherapy class -Will see the pt back in 2 weeks with the above work up   . Orders Placed This Encounter  Procedures  . NM PET Image Initial (PI) Skull Base To Thigh    Standing Status:   Future    Standing Expiration Date:   11/14/2018    Order Specific  Question:   ** REASON FOR EXAM (FREE TEXT)    Answer:   Intial staging for diffuse large B cell lymphoma    Order Specific Question:   If indicated for the ordered procedure, I authorize the administration of a radiopharmaceutical per Radiology protocol    Answer:   Yes    Order Specific Question:   Preferred imaging location?    Answer:   [facility]    Order Specific Question:   Radiology Contrast Protocol - do NOT remove file path    Answer:   [email or web address]  . IR IMAGING GUIDED PORT INSERTION    Standing Status:   Future    Standing Expiration Date:   01/15/2019    Order Specific Question:   Reason for Exam (SYMPTOM  OR DIAGNOSIS REQUIRED)    Answer:   Port a cath  placement for chemotherapy for diffuse large B cell lymphoma    Order Specific Question:   Preferred Imaging Location?    Answer:   [facility]  . CBC with Differential/Platelet    Standing Status:   Future    Number of Occurrences:   1    Standing Expiration Date:   12/19/2018  . CMP ([facility] only)    Standing Status:   Future    Number of Occurrences:   1    Standing Expiration Date:   11/15/2018  . Lactate dehydrogenase    Standing Status:   Future    Number of Occurrences:   1    Standing Expiration Date:   11/15/2018  . Hepatitis C antibody    Standing Status:   Future    Number of Occurrences:   1    Standing Expiration Date:   11/15/2018  . Hepatitis B core antibody, total    Standing Status:   Future    Number of Occurrences:   1    Standing Expiration Date:   11/14/2018  . Hepatitis B surface antigen    Standing Status:   Future    Number of Occurrences:   1    Standing Expiration Date:   12/19/2018  . HIV antibody    Standing Status:   Future    Number of Occurrences:   1    Standing Expiration Date:   11/15/2018  . ECHOCARDIOGRAM COMPLETE    Pre-chemotherapy cardiac evaluation    Standing Status:   Future    Number of Occurrences:   1    Standing Expiration Date:   02/14/2019    Order Specific Question:   Where should this test be performed    Answer:   [facility]    Order Specific Question:   Perflutren DEFINITY (image enhancing agent) should be administered unless hypersensitivity or allergy exist    Answer:   Administer Perflutren     Labs ASAP PET/CT in 5 days MRI brain in 2-3 days Port a cath placement in 3-5 days ECHO in 3-4 days RTC with Dr Irene Limbo in 2 weeks with labs  All of the patients questions were answered with apparent satisfaction. The patient knows to call the clinic with any problems, questions or concerns.  The total time spent in the appt was 60 minutes and more than 50% was on counseling and direct patient cares.    Sullivan Lone  MD [place] AAHIVMS [facility] Hematology/Oncology Physician [facility]  (Office):       [phone] (Work cell):  [phone] (Fax):  [phone]  11/12/2017 5:51 PM  I, Baldwin [place], am acting as a scribe for Dr. Irene Limbo  .I have reviewed the above documentation for accuracy and completeness, and I agree with the above. Brunetta Genera MD

## 2017-11-14 ENCOUNTER — Telehealth: Payer: Self-pay

## 2017-11-14 ENCOUNTER — Inpatient Hospital Stay: Payer: 59

## 2017-11-14 ENCOUNTER — Other Ambulatory Visit: Payer: Self-pay | Admitting: Hematology

## 2017-11-14 DIAGNOSIS — C8338 Diffuse large B-cell lymphoma, lymph nodes of multiple sites: Secondary | ICD-10-CM

## 2017-11-14 DIAGNOSIS — R519 Headache, unspecified: Secondary | ICD-10-CM

## 2017-11-14 DIAGNOSIS — R51 Headache: Secondary | ICD-10-CM

## 2017-11-14 LAB — CMP (CANCER CENTER ONLY)
ALT: 50 U/L — AB (ref 0–44)
AST: 51 U/L — AB (ref 15–41)
Albumin: 4.1 g/dL (ref 3.5–5.0)
Alkaline Phosphatase: 57 U/L (ref 38–126)
Anion gap: 10 (ref 5–15)
BUN: 19 mg/dL (ref 6–20)
CHLORIDE: 108 mmol/L (ref 98–111)
CO2: 24 mmol/L (ref 22–32)
Calcium: 9.4 mg/dL (ref 8.9–10.3)
Creatinine: 1.11 mg/dL (ref 0.61–1.24)
GFR, Estimated: >60 mL/min (ref >60)
Glucose, Bld: 94 mg/dL (ref 70–99)
POTASSIUM: 4.2 mmol/L (ref 3.5–5.1)
SODIUM: 142 mmol/L (ref 135–145)
Total Bilirubin: 0.7 mg/dL (ref 0.3–1.2)
Total Protein: 7.1 g/dL (ref 6.5–8.1)

## 2017-11-14 LAB — LACTATE DEHYDROGENASE: LDH: 219 U/L — ABNORMAL HIGH (ref 98–192)

## 2017-11-14 NOTE — Telephone Encounter (Signed)
Per 9/4 no los °

## 2017-11-14 NOTE — Telephone Encounter (Signed)
Left a brief message for the patient concerning a add on appointment. Per 9/6 verbal add on from [facility] to schedule the patient to f/u appointment in 2 weeks. Also stated 3x for the patient to still come for their lab add on for today.

## 2017-11-15 LAB — HEPATITIS C ANTIBODY: HCV AB: 0.1 {s_co_ratio} (ref 0.0–0.9)

## 2017-11-15 LAB — HEPATITIS B CORE ANTIBODY, TOTAL: Hep B Core Total Ab: NEGATIVE

## 2017-11-15 LAB — HEPATITIS B SURFACE ANTIGEN: Hepatitis B Surface Ag: NEGATIVE

## 2017-11-15 LAB — HIV ANTIBODY (ROUTINE TESTING W REFLEX): HIV Screen 4th Generation wRfx: NONREACTIVE

## 2017-11-20 ENCOUNTER — Encounter: Payer: Self-pay | Admitting: Hematology

## 2017-11-21 ENCOUNTER — Ambulatory Visit (HOSPITAL_COMMUNITY)
Admission: RE | Admit: 2017-11-21 | Discharge: 2017-11-21 | Disposition: A | Discharge status: Home / Self Care | Payer: 59 | Source: Ambulatory Visit | Attending: Hematology | Admitting: Hematology

## 2017-11-21 ENCOUNTER — Ambulatory Visit (HOSPITAL_BASED_OUTPATIENT_CLINIC_OR_DEPARTMENT_OTHER)
Admission: RE | Admit: 2017-11-21 | Discharge: 2017-11-21 | Disposition: A | Discharge status: Home / Self Care | Payer: 59 | Source: Ambulatory Visit | Attending: Hematology | Admitting: Hematology

## 2017-11-21 DIAGNOSIS — R001 Bradycardia, unspecified: Secondary | ICD-10-CM | POA: Diagnosis not present

## 2017-11-21 DIAGNOSIS — C8338 Diffuse large B-cell lymphoma, lymph nodes of multiple sites: Secondary | ICD-10-CM

## 2017-11-21 DIAGNOSIS — C8519 Unspecified B-cell lymphoma, extranodal and solid organ sites: Secondary | ICD-10-CM | POA: Diagnosis not present

## 2017-11-21 DIAGNOSIS — R002 Palpitations: Secondary | ICD-10-CM | POA: Insufficient documentation

## 2017-11-21 DIAGNOSIS — R519 Headache, unspecified: Secondary | ICD-10-CM

## 2017-11-21 DIAGNOSIS — R51 Headache: Secondary | ICD-10-CM | POA: Insufficient documentation

## 2017-11-21 DIAGNOSIS — E785 Hyperlipidemia, unspecified: Secondary | ICD-10-CM | POA: Diagnosis not present

## 2017-11-21 DIAGNOSIS — I081 Rheumatic disorders of both mitral and tricuspid valves: Secondary | ICD-10-CM | POA: Insufficient documentation

## 2017-11-21 DIAGNOSIS — I10 Essential (primary) hypertension: Secondary | ICD-10-CM | POA: Insufficient documentation

## 2017-11-21 MED ORDER — GADOBUTROL 1 MMOL/ML IV SOLN
9.0000 mL/kg | Freq: Once | INTRAVENOUS | Status: AC | PRN
  Administered 2017-11-21: 7 mL via INTRAVENOUS

## 2017-11-21 NOTE — Progress Notes (Signed)
  Echocardiogram 2D Echocardiogram has been performed.  Merrie Roof F 11/21/2017, 2:11 PM

## 2017-11-24 DIAGNOSIS — M79671 Pain in right foot: Secondary | ICD-10-CM | POA: Diagnosis not present

## 2017-11-24 DIAGNOSIS — B079 Viral wart, unspecified: Secondary | ICD-10-CM | POA: Diagnosis not present

## 2017-11-25 ENCOUNTER — Ambulatory Visit (HOSPITAL_COMMUNITY)
Admission: RE | Admit: 2017-11-25 | Discharge: 2017-11-25 | Disposition: A | Discharge status: Home / Self Care | Payer: 59 | Source: Ambulatory Visit | Attending: Hematology | Admitting: Hematology

## 2017-11-25 ENCOUNTER — Encounter (HOSPITAL_COMMUNITY): Payer: Self-pay

## 2017-11-25 ENCOUNTER — Telehealth: Payer: Self-pay

## 2017-11-25 DIAGNOSIS — D3501 Benign neoplasm of right adrenal gland: Secondary | ICD-10-CM | POA: Insufficient documentation

## 2017-11-25 DIAGNOSIS — C8338 Diffuse large B-cell lymphoma, lymph nodes of multiple sites: Secondary | ICD-10-CM

## 2017-11-25 DIAGNOSIS — C8330 Diffuse large B-cell lymphoma, unspecified site: Secondary | ICD-10-CM | POA: Diagnosis not present

## 2017-11-25 DIAGNOSIS — E042 Nontoxic multinodular goiter: Secondary | ICD-10-CM | POA: Insufficient documentation

## 2017-11-25 DIAGNOSIS — N6320 Unspecified lump in the left breast, unspecified quadrant: Secondary | ICD-10-CM | POA: Insufficient documentation

## 2017-11-25 LAB — GLUCOSE, CAPILLARY: Glucose-Capillary: 91 mg/dL (ref 70–99)

## 2017-11-25 MED ORDER — FLUDEOXYGLUCOSE F - 18 (FDG) INJECTION
10.4000 | Freq: Once | INTRAVENOUS | Status: AC
  Administered 2017-11-25: 10.4 via INTRAVENOUS

## 2017-11-25 NOTE — Telephone Encounter (Signed)
Per 9/17 spoke with patient concerning scheduling appointment for chemo edu. Per RN Lanelle Bal) request from [facility]

## 2017-11-26 ENCOUNTER — Other Ambulatory Visit: Payer: Self-pay | Admitting: Physician Assistant

## 2017-11-26 ENCOUNTER — Inpatient Hospital Stay: Payer: 59

## 2017-11-26 ENCOUNTER — Other Ambulatory Visit: Payer: Self-pay

## 2017-11-26 ENCOUNTER — Encounter: Payer: Self-pay | Admitting: Physician Assistant

## 2017-11-26 DIAGNOSIS — C8338 Diffuse large B-cell lymphoma, lymph nodes of multiple sites: Secondary | ICD-10-CM

## 2017-11-27 ENCOUNTER — Encounter: Payer: Self-pay | Admitting: Hematology

## 2017-11-27 ENCOUNTER — Inpatient Hospital Stay (HOSPITAL_BASED_OUTPATIENT_CLINIC_OR_DEPARTMENT_OTHER): Payer: 59 | Admitting: Hematology

## 2017-11-27 ENCOUNTER — Inpatient Hospital Stay: Payer: 59

## 2017-11-27 ENCOUNTER — Ambulatory Visit: Payer: 59

## 2017-11-27 VITALS — BP 129/92 | HR 74 | Temp 98.0°F | Resp 18 | Ht 67.0 in | Wt 189.4 lb

## 2017-11-27 DIAGNOSIS — R51 Headache: Secondary | ICD-10-CM | POA: Diagnosis not present

## 2017-11-27 DIAGNOSIS — Z79899 Other long term (current) drug therapy: Secondary | ICD-10-CM

## 2017-11-27 DIAGNOSIS — R222 Localized swelling, mass and lump, trunk: Secondary | ICD-10-CM

## 2017-11-27 DIAGNOSIS — C8338 Diffuse large B-cell lymphoma, lymph nodes of multiple sites: Secondary | ICD-10-CM | POA: Diagnosis not present

## 2017-11-27 DIAGNOSIS — E7800 Pure hypercholesterolemia, unspecified: Secondary | ICD-10-CM

## 2017-11-27 DIAGNOSIS — D3501 Benign neoplasm of right adrenal gland: Secondary | ICD-10-CM

## 2017-11-27 DIAGNOSIS — F419 Anxiety disorder, unspecified: Secondary | ICD-10-CM

## 2017-11-27 DIAGNOSIS — E041 Nontoxic single thyroid nodule: Secondary | ICD-10-CM

## 2017-11-27 DIAGNOSIS — I10 Essential (primary) hypertension: Secondary | ICD-10-CM

## 2017-11-27 LAB — CMP (CANCER CENTER ONLY)
ALBUMIN: 4.2 g/dL (ref 3.5–5.0)
ALT: 21 U/L (ref 0–44)
AST: 19 U/L (ref 15–41)
Alkaline Phosphatase: 65 U/L (ref 38–126)
Anion gap: 9 (ref 5–15)
BILIRUBIN TOTAL: 0.7 mg/dL (ref 0.3–1.2)
BUN: 20 mg/dL (ref 6–20)
CHLORIDE: 106 mmol/L (ref 98–111)
CO2: 27 mmol/L (ref 22–32)
Calcium: 9.4 mg/dL (ref 8.9–10.3)
Creatinine: 1.31 mg/dL — ABNORMAL HIGH (ref 0.61–1.24)
GFR, Est AFR Am: >60 mL/min (ref >60)
GFR, Estimated: >60 mL/min (ref >60)
GLUCOSE: 101 mg/dL — AB (ref 70–99)
POTASSIUM: 4.4 mmol/L (ref 3.5–5.1)
SODIUM: 142 mmol/L (ref 135–145)
TOTAL PROTEIN: 7.2 g/dL (ref 6.5–8.1)

## 2017-11-27 LAB — CBC WITH DIFFERENTIAL (CANCER CENTER ONLY)
BASOS ABS: 0.1 10*3/uL (ref 0.0–0.1)
BASOS PCT: 1 %
Eosinophils Absolute: 0.1 10*3/uL (ref 0.0–0.5)
Eosinophils Relative: 1 %
HEMATOCRIT: 45 % (ref 38.4–49.9)
HEMOGLOBIN: 15 g/dL (ref 13.0–17.1)
Lymphocytes Relative: 14 %
Lymphs Abs: 1.4 10*3/uL (ref 0.9–3.3)
MCH: 29.3 pg (ref 27.2–33.4)
MCHC: 33.4 g/dL (ref 32.0–36.0)
MCV: 87.9 fL (ref 79.3–98.0)
MONO ABS: 0.6 10*3/uL (ref 0.1–0.9)
Monocytes Relative: 6 %
NEUTROS ABS: 8.3 10*3/uL — AB (ref 1.5–6.5)
NEUTROS PCT: 78 %
Platelet Count: 216 10*3/uL (ref 140–400)
RBC: 5.12 MIL/uL (ref 4.20–5.82)
RDW: 13.7 % (ref 11.0–14.6)
WBC: 10.5 10*3/uL — AB (ref 4.0–10.3)

## 2017-11-27 LAB — LACTATE DEHYDROGENASE: LDH: 208 U/L — ABNORMAL HIGH (ref 98–192)

## 2017-11-27 MED ORDER — ONDANSETRON HCL 8 MG PO TABS: 8.0000 mg | ORAL_TABLET | Freq: Two times a day (BID) | ORAL | 1 refills | Status: DC | PRN

## 2017-11-27 MED ORDER — PROCHLORPERAZINE MALEATE 10 MG PO TABS: 10.0000 mg | ORAL_TABLET | Freq: Four times a day (QID) | ORAL | 6 refills | Status: DC | PRN

## 2017-11-27 MED ORDER — PREDNISONE 20 MG PO TABS: 60.0000 mg | ORAL_TABLET | Freq: Every day | ORAL | 6 refills | Status: DC

## 2017-11-27 MED ORDER — LIDOCAINE-PRILOCAINE 2.5-2.5 % EX CREA: TOPICAL_CREAM | CUTANEOUS | 3 refills | Status: DC

## 2017-11-27 MED ORDER — LORAZEPAM 0.5 MG PO TABS: 0.5000 mg | ORAL_TABLET | Freq: Four times a day (QID) | ORAL | 0 refills | Status: DC | PRN

## 2017-11-27 MED ORDER — INFLUENZA VAC SPLIT QUAD 0.5 ML IM SUSY
PREFILLED_SYRINGE | INTRAMUSCULAR | Status: AC
  Filled 2017-11-27: qty 0.5

## 2017-11-27 MED ORDER — INFLUENZA VAC SPLIT QUAD 0.5 ML IM SUSY
0.5000 mL | PREFILLED_SYRINGE | Freq: Once | INTRAMUSCULAR | Status: AC
  Administered 2017-11-27: 0.5 mL via INTRAMUSCULAR

## 2017-11-27 NOTE — Progress Notes (Signed)
HEMATOLOGY/ONCOLOGY CONSULTATION NOTE  Date of Service: 11/27/2017  Patient Care Team: Orpah Melter, MD as PCP - General (Family Medicine)  CHIEF COMPLAINTS/PURPOSE OF CONSULTATION:  Newly diagnosed Diffuse Large B-Cell Lymphoma   HISTORY OF PRESENTING ILLNESS:   Alex Mendoza is a wonderful 47 y.o. male who has been referred to [place] by Alesia Morin, NP for evaluation and management of Diffuse Large B-Cell Lymphoma. He is accompanied today by his wife. The pt reports that he is doing well overall.   The pt presented to care with his PCP Alesia Morin on 10/29/17 complaining of a swollen left armpit and a lump on left pectoral area that had not changed in 12 months. He first noted the left armpit 3 weeks ago, which he notes is growing quickly.   The pt reports that he occasionally gets headaches that present with some numbness and other feelings that he notes are difficult to describe. He notes that these headaches have worsened over the last 6 months and have caused him to miss work. He notes that these headaches seem to concentrate in the center of his forehead and he has needed to use up to 4-6 aspirin. He notes that his headaches have been evaluated in January 2018 with a MRI Brain which was normal.   The pt notes that he did have a deer tick bite in July 2017 and received prophylactic antibiotics.   The pt notes that he stays very active and eats healthy in general. He notes that he has anxiety and tries to mitigate his anxiety with cardio and staying active. He also recently began Buspirone and notes that he has been sleeping better in the last week.   Of note prior to the patient's visit today, pt has had Left breast nodule and Left axillary biopsies completed on 10/31/17 with results revealing Diffuse Large B-Cell Lymphoma.   Most recent lab results (10/29/17) of CBC is as follows: all values are WNL except for ANC at 8.5k.   On review of systems, pt reports staying active,  worsened headaches over 6 months, good energy levels, some anxiety, and denies skin rashes, drenching night sweats, fevers, chills, unexpected weight loss, abdominal pains, SOB, chest discomfort, leg swelling, joint pain and swelling, and any other symptoms.   On PMHx the pt reports borderline hypertension, hyperlipidemia, occasional somnambulism, choleycystectomy in 2007, left knee surgery. He denies sleep apnea.  On Social Hx the pt denies any ETOH consumption and denies ever smoking.  On Family Hx the pt reports paternal aunt with brain tumor, and denies blood or cancer disorders or CAD.  He reports sulfa antibiotic allergies with whole body rash.  Interval History:   Alex Mendoza returns today for management and evaluation of his Diffuse Large B-Cell Lymphoma. The patient's last visit with [place] was on 11/12/17. He is accompanied today by his wife. The pt reports that he is doing well overall.   The pt reports that he stopped eating "junk food" and has been working out in the interim. He endorses that his headaches have improved in the interim.   The pt notes that that the lump he had in his chest was present for about a year.   Of note since the patient's last visit, pt has had a PET/CT completed on 11/25/17 with results revealing Hypermetabolic left axillary adenopathy and outer left breast mass, compatible with biopsy-proven lymphoma. 2. Hypermetabolic bilateral thyroid lobe nodules and hypermetabolic left levels 4 and 7 neck lymph nodes. These findings could  all be due to involvement by lymphoma, although the possibility of a second primary malignancy of the thyroid cannot be excluded. 3. No additional hypermetabolic sites of disease. Normal size and activity of the spleen. 4. Right adrenal adenoma.  The pt also had an MRI Brain on 11/21/17 which revealed Normal examination.  No evidence of malignant involvement.   Lab results today (11/27/17) of CBC w/diff, CMP, and Reticulocytes is as  follows: all values are WNL except for WBC at 10.5k, ANC at 8.3k, Glucose at 101, Creatinine at 1.31. 11/27/17 LDH at 208  On review of systems, pt reports improved headaches, stable energy levels, stable lymph nodes, and denies fevers, chills, night sweats, concerns for infections, and any other symptoms.    MEDICAL HISTORY:  Past Medical History:  Diagnosis Date  . Bradycardia 07/20/2015  . Hypercholesteremia   . Hypertension   . Palpitations 07/20/2015    SURGICAL HISTORY: Past Surgical History:  Procedure Laterality Date  . DG GALL BLADDER    . KNEE SURGERY Right   . SHOULDER SURGERY Left     SOCIAL HISTORY: Social History   Socioeconomic History  . Marital status: Married    Spouse name: Margaretha Sheffield  . Number of children: 2  . Years of education: 26  . Highest education level: Not on file  Occupational History    Comment: [employer]  Social Needs  . Financial resource strain: Not on file  . Food insecurity:    Worry: Not on file    Inability: Not on file  . Transportation needs:    Medical: Not on file    Non-medical: Not on file  Tobacco Use  . Smoking status: Never Smoker  . Smokeless tobacco: Never Used  Substance and Sexual Activity  . Alcohol use: No    Alcohol/week: 0.0 standard drinks  . Drug use: No  . Sexual activity: Not on file  Lifestyle  . Physical activity:    Days per week: Not on file    Minutes per session: Not on file  . Stress: Not on file  Relationships  . Social connections:    Talks on phone: Not on file    Gets together: Not on file    Attends religious service: Not on file    Active member of club or organization: Not on file    Attends meetings of clubs or organizations: Not on file    Relationship status: Not on file  . Intimate partner violence:    Fear of current or ex partner: Not on file    Emotionally abused: Not on file    Physically abused: Not on file    Forced sexual activity: Not on file  Other Topics Concern  . Not on  file  Social History Narrative   Lives with wife, family   Caffeine- some chocolate    FAMILY HISTORY: Family History  Problem Relation Age of Onset  . Atrial fibrillation Mother   . Heart failure Maternal Grandmother   . Heart failure Paternal Grandmother   . Bradycardia Father   . Cancer Paternal Aunt     ALLERGIES:  is allergic to sulfa antibiotics.  MEDICATIONS:  Current Outpatient Medications  Medication Sig Dispense Refill  . busPIRone (BUSPAR) 5 MG tablet Take 5 mg by mouth 2 (two) times daily.    . quinapril (ACCUPRIL) 20 MG tablet Take 10 mg by mouth every morning.    . rosuvastatin (CRESTOR) 10 MG tablet Take 10 mg by mouth daily.  No current facility-administered medications for this visit.    Facility-Administered Medications Ordered in Other Visits  Medication Dose Route Frequency Provider Last Rate Last Dose  . gadopentetate dimeglumine (MAGNEVIST) injection 18 mL  18 mL Intravenous Once PRN Penumalli, Earlean Polka, MD        REVIEW OF SYSTEMS:    A 10+ POINT REVIEW OF SYSTEMS WAS OBTAINED including neurology, dermatology, psychiatry, cardiac, respiratory, lymph, extremities, GI, GU, Musculoskeletal, constitutional, breasts, reproductive, HEENT.  All pertinent positives are noted in the HPI.  All others are negative.   PHYSICAL EXAMINATION: ECOG PERFORMANCE STATUS: 1 - Symptomatic but completely ambulatory  . Vitals:   11/27/17 1509  BP: (!) 129/92  Pulse: 74  Resp: 18  Temp: 98 F (36.7 C)  SpO2: 100%   Filed Weights   11/27/17 1509  Weight: 189 lb 6.4 oz (85.9 kg)   .Body mass index is 29.66 kg/m.   GENERAL:alert, in no acute distress and comfortable SKIN: no acute rashes, no significant lesions EYES: conjunctiva are pink and non-injected, sclera anicteric OROPHARYNX: MMM, no exudates, no oropharyngeal erythema or ulceration NECK: supple, no JVD LYMPH:  4-5cm LN in left axilla, 2-3cm LN in chest wall, no palpable lymphadenopathy in the  cervical or inguinal regions LUNGS: clear to auscultation b/l with normal respiratory effort HEART: regular rate & rhythm ABDOMEN:  normoactive bowel sounds , non tender, not distended. No palpable hepatosplenomegaly.  Extremity: no pedal edema PSYCH: alert & oriented x 3 with fluent speech NEURO: no focal motor/sensory deficits   LABORATORY DATA:  I have reviewed the data as listed  . CBC Latest Ref Rng & Units 11/27/2017  WBC 4.0 - 10.3 K/uL 10.5(H)  Hemoglobin 13.0 - 17.1 g/dL 15.0  Hematocrit 38.4 - 49.9 % 45.0  Platelets 140 - 400 K/uL 216   Component     Latest Ref Rng & Units 11/14/2017  Sodium     135 - 145 mmol/L 142  Potassium     3.5 - 5.1 mmol/L 4.2  Chloride     98 - 111 mmol/L 108  CO2     22 - 32 mmol/L 24  Glucose     70 - 99 mg/dL 94  BUN     6 - 20 mg/dL 19  Creatinine     0.61 - 1.24 mg/dL 1.11  Calcium     8.9 - 10.3 mg/dL 9.4  Total Protein     6.5 - 8.1 g/dL 7.1  Albumin     3.5 - 5.0 g/dL 4.1  AST     15 - 41 U/L 51 (H)  ALT     0 - 44 U/L 50 (H)  Alkaline Phosphatase     38 - 126 U/L 57  Total Bilirubin     0.3 - 1.2 mg/dL 0.7  GFR, Est Non African American     >60 mL/min >60  GFR, Est African American     >60 mL/min >60  Anion gap     5 - 15 10  LDH     98 - 192 U/L 219 (H)  HCV Ab     0.0 - 0.9 s/co ratio 0.1  Hep B Core Ab, Tot     Negative Negative  Hepatitis B Surface Ag     Negative Negative  HIV Screen 4th Generation wRfx     Non Reactive Non Reactive     10/29/17 CBC w/diff and CMP:    10/31/17 Left Breast and Left Axilla Biopsies:  RADIOGRAPHIC STUDIES: I have personally reviewed the radiological images as listed and agreed with the findings in the report. Mr Jeri Cos Wo Contrast  Result Date: 11/21/2017 CLINICAL DATA:  Diffuse large B cell lymphoma of the left breast. EXAM: MRI HEAD WITHOUT AND WITH CONTRAST TECHNIQUE: Multiplanar, multiecho pulse sequences of the brain and surrounding structures were  obtained without and with intravenous contrast. CONTRAST:  7 cc Gadovist COMPARISON:  05/01/2016 FINDINGS: Brain: Brain has normal appearance without evidence of malformation, atrophy, old or acute small or large vessel infarction, mass lesion, hemorrhage, hydrocephalus or extra-axial collection. After contrast administration, no abnormal enhancement occurs. Vascular: Major vessels at the base of the brain show flow. Venous sinuses appear patent. Skull and upper cervical spine: Normal. Sinuses/Orbits: Clear/normal. Other: None significant. IMPRESSION: Normal examination.  No evidence of malignant involvement. Electronically Signed   By: Nelson Chimes M.D.   On: 11/21/2017 20:05   Nm Pet Image Initial (pi) Skull Base To Thigh  Result Date: 11/26/2017 CLINICAL DATA:  Initial treatment strategy for diffuse large B-cell lymphoma. EXAM: NUCLEAR MEDICINE PET SKULL BASE TO THIGH TECHNIQUE: 10.4 mCi F-18 FDG was injected intravenously. Full-ring PET imaging was performed from the skull base to thigh after the radiotracer. CT data was obtained and used for attenuation correction and anatomic localization. Fasting blood glucose: 91 mg/dl COMPARISON:  None. FINDINGS: Mediastinal blood pool activity: SUV max 2.3 NECK: There are hypermetabolic bilateral thyroid lobe hypodense nodules, largest 1.5 cm in upper left thyroid lobe with max SUV 28.1 (series 4/image 40) and 1.3 cm with max SUV 18.4 in the lower right thyroid lobe (series 4/image 52). There is a hypermetabolic calcified 0.7 cm left level 7 (delphian) neck lymph node posterior and inferior to the left thyroid lobe with max SUV 5.3 (series 4/image 54). There are clustered hypermetabolic left level 4 neck lymph nodes, largest 1.2 cm with max SUV 6.5 (series 4/image 52). Incidental CT findings: none CHEST: Hypermetabolic 1.3 cm subcutaneous outer left breast soft tissue mass with max SUV 8.1 (series 4/image 67), containing internal biopsy clip. Moderate hypermetabolic  left axillary adenopathy, with dominant 2.3 cm left axillary node with max SUV 21.9 (series 4/image 64). No hypermetabolic right axillary, mediastinal or hilar nodes. No hypermetabolic pulmonary findings. Incidental CT findings: No acute consolidative airspace disease, lung masses or significant pulmonary nodules. ABDOMEN/PELVIS: No abnormal hypermetabolic activity within the liver, pancreas, adrenal glands, or spleen. No hypermetabolic lymph nodes in the abdomen or pelvis. Incidental CT findings: Cholecystectomy. Right adrenal 2.5 cm adenoma with density 3 HU. SKELETON: No focal hypermetabolic activity to suggest skeletal metastasis. Incidental CT findings: Soft tissue anchors are noted in the right glenoid. IMPRESSION: 1. Hypermetabolic left axillary adenopathy and outer left breast mass, compatible with biopsy-proven lymphoma. 2. Hypermetabolic bilateral thyroid lobe nodules and hypermetabolic left levels 4 and 7 neck lymph nodes. These findings could all be due to involvement by lymphoma, although the possibility of a second primary malignancy of the thyroid cannot be excluded. 3. No additional hypermetabolic sites of disease. Normal size and activity of the spleen. 4. Right adrenal adenoma. Electronically Signed   By: Ilona Sorrel M.D.   On: 11/26/2017 09:36   US Breast Ltd Uni Left Inc Axilla  Result Date: 10/31/2017 CLINICAL DATA:  Patient presents with a palpable left breast mass, which he says has been present for between 1 and 2 years. More recently, patient has a palpable a lump in the left axilla. EXAM: DIGITAL DIAGNOSTIC BILATERAL MAMMOGRAM WITH CAD AND  TOMO ULTRASOUND LEFT BREAST COMPARISON:  None ACR Breast Density Category a: The breast tissue is almost entirely fatty. FINDINGS: The that there is an irregular mass in the upper outer left breast corresponding to the palpable abnormality in the. There is a smaller, adjacent irregular mass. No other breast masses. Mammographic images were processed  with CAD. On physical exam, there is a firm palpable mass in the upper outer left breast. There is a soft palpable lump in the left axilla. Targeted ultrasound is performed, showing an irregular hypoechoic mass in the left breast at 12:30 o'clock, 7 cm the nipple, measuring 13 x 12 x 14 mm. The mass is vascular with prominent blood flow on color Doppler analysis. Peripheral to this, at the 1 o'clock position, 8 cm the nipple, there is a small irregular hypoechoic mass measuring 5 x 3 x 4 mm. In the left axilla, there are 3 discrete enlarged/abnormal lymph nodes. The largest node measures 4.3 x 1.9 x 3.5 cm. IMPRESSION: 1. Left breast mass in which is highly suspicious for breast malignancy. There is a smaller adjacent mass that is consistent with a satellite lesion. There are 3 discrete abnormal/enlarged left axillary lymph nodes consistent with metastatic disease. RECOMMENDATION: 1. Ultrasound-guided core needle biopsy of the primary left breast mass on the largest left axillary lymph nodes. This will be performed today. I have discussed the findings and recommendations with the patient. Results were also provided in writing at the conclusion of the visit. If applicable, a reminder letter will be sent to the patient regarding the next appointment. BI-RADS CATEGORY  5: Highly suggestive of malignancy. Electronically Signed   By: Lajean Manes M.D.   On: 10/31/2017 12:31   Mm Diag Breast Tomo Bilateral  Result Date: 10/31/2017 CLINICAL DATA:  Patient presents with a palpable left breast mass, which he says has been present for between 1 and 2 years. More recently, patient has a palpable a lump in the left axilla. EXAM: DIGITAL DIAGNOSTIC BILATERAL MAMMOGRAM WITH CAD AND TOMO ULTRASOUND LEFT BREAST COMPARISON:  None ACR Breast Density Category a: The breast tissue is almost entirely fatty. FINDINGS: The that there is an irregular mass in the upper outer left breast corresponding to the palpable abnormality in the.  There is a smaller, adjacent irregular mass. No other breast masses. Mammographic images were processed with CAD. On physical exam, there is a firm palpable mass in the upper outer left breast. There is a soft palpable lump in the left axilla. Targeted ultrasound is performed, showing an irregular hypoechoic mass in the left breast at 12:30 o'clock, 7 cm the nipple, measuring 13 x 12 x 14 mm. The mass is vascular with prominent blood flow on color Doppler analysis. Peripheral to this, at the 1 o'clock position, 8 cm the nipple, there is a small irregular hypoechoic mass measuring 5 x 3 x 4 mm. In the left axilla, there are 3 discrete enlarged/abnormal lymph nodes. The largest node measures 4.3 x 1.9 x 3.5 cm. IMPRESSION: 1. Left breast mass in which is highly suspicious for breast malignancy. There is a smaller adjacent mass that is consistent with a satellite lesion. There are 3 discrete abnormal/enlarged left axillary lymph nodes consistent with metastatic disease. RECOMMENDATION: 1. Ultrasound-guided core needle biopsy of the primary left breast mass on the largest left axillary lymph nodes. This will be performed today. I have discussed the findings and recommendations with the patient. Results were also provided in writing at the conclusion of the visit. If  applicable, a reminder letter will be sent to the patient regarding the next appointment. BI-RADS CATEGORY  5: Highly suggestive of malignancy. Electronically Signed   By: Lajean Manes M.D.   On: 10/31/2017 12:31   [place] Axillary Node Core Biopsy Left  Addendum Date: 11/12/2017   ADDENDUM REPORT: 11/12/2017 07:20 ADDENDUM: A consultation has been arranged with Dr Sullivan Lone, Hematology Oncology, with [place], [place], for November 12, 2017. Pathology results reported by Roselind Messier, RN on 11/11/2017. Electronically Signed   By: Lajean Manes M.D.   On: 11/12/2017 07:20   Addendum Date: 11/06/2017   ADDENDUM REPORT: 11/06/2017 11:09  ADDENDUM: Pathology revealed DIFFUSE LARGE B-CELL LYMPHOMA of LEFT breast, upper outer quadrant, 12:30 o'clock. This was found to be concordant by Dr. Lajean Manes. Pathology revealed DIFFUSE LARGE B-CELL LYMPHOMA, of lymph node, left axilla. This was found to be concordant by Dr. Lajean Manes. Pathology results were discussed with the patient by telephone. The patient reported doing well after the biopsy with tenderness at the site. Post biopsy instructions and care were reviewed and questions were answered. The patient was encouraged to call [facility] for any additional concerns. I have contacted Isaiah Blakes, New Patient Scheduler, with [facility], [place], [place], with referral information and for an appointment with Dr. Sullivan Lone, Oncologist. Seth Bake will contact the patient with an appointment date and time. Pathology results reported by Roselind Messier, RN on 11/06/2017. Electronically Signed   By: Lajean Manes M.D.   On: 11/06/2017 11:09   Result Date: 11/12/2017 CLINICAL DATA:  Patient presents ultrasound-guided core needle biopsy of an upper outer quadrant left breast mass and 1 of the 3 enlarged left axillary lymph nodes. EXAM: ULTRASOUND GUIDED LEFT BREAST CORE NEEDLE BIOPSY ULTRASOUND GUIDED LEFT LEFT AXILLARY LYMPH NODE CORE NEEDLE BIOPSY COMPARISON:  Current diagnostic mammograms and ultrasound. FINDINGS: I met with the patient and we discussed the procedure of ultrasound-guided biopsy, including benefits and alternatives. We discussed the high likelihood of a successful procedure. We discussed the risks of the procedure, including infection, bleeding, tissue injury, clip migration, and inadequate sampling. Informed written consent was given. The usual time-out protocol was performed immediately prior to the procedure. Lesion #1: 12:30 oclock mass. Lesion quadrant: Upper outer quadrant Using sterile technique and 1% Lidocaine as local anesthetic, under direct  ultrasound visualization, a 12 gauge spring-loaded device was used to perform biopsy of the 12:30 o'clock position left breast mass using an inferomedial approach. At the conclusion of the procedure a ribbon shaped tissue marker clip was deployed into the biopsy cavity. Lesion #2:  Left axillary lymph node Using sterile technique and 1% Lidocaine as local anesthetic, under direct ultrasound visualization, a 14 gauge spring-loaded device was used to perform biopsy of the largest of the 3 abnormal left axillary lymph nodes using an inferior approach. At the conclusion of the procedure a HydroMARK tissue marker clip was deployed into the biopsy cavity. Follow up 2 view mammogram was performed and dictated separately. IMPRESSION: Ultrasound guided biopsy of a left breast upper outer quadrant mass and an abnormal left axillary lymph node. No apparent complications. Electronically Signed: By: Lajean Manes M.D. On: 10/31/2017 13:16   Mm Clip Placement Left  Result Date: 10/31/2017 CLINICAL DATA:  Status post ultrasound-guided left breast core needle biopsy of left axillary lymph node biopsy EXAM: DIAGNOSTIC LEFT MAMMOGRAM POST ULTRASOUND BIOPSY COMPARISON:  Previous exam(s). FINDINGS: Mammographic images were obtained following ultrasound guided biopsy of  an upper outer quadrant left breast mass and an enlarged left axillary lymph node. The ribbon shaped clip lies within the posterosuperior aspect of the breast mass. HydroMARK clip and the enlarged left axillary lymph node are beyond the mammographic feel dizzy view. The lymph node clip was well seen sonographically deploying within the mass. IMPRESSION: Well-positioned ribbon shaped biopsy clip within the left breast upper outer quadrant mass. Final Assessment: Post Procedure Mammograms for Marker Placement Electronically Signed   By: Lajean Manes M.D.   On: 10/31/2017 13:24   [place] Lt Breast Bx W Loc Dev 1st Lesion Img Bx Spec US Guide  Addendum Date: 11/12/2017     ADDENDUM REPORT: 11/12/2017 07:20 ADDENDUM: A consultation has been arranged with Dr Sullivan Lone, Hematology Oncology, with [place], [place], for November 12, 2017. Pathology results reported by Roselind Messier, RN on 11/11/2017. Electronically Signed   By: Lajean Manes M.D.   On: 11/12/2017 07:20   Addendum Date: 11/06/2017   ADDENDUM REPORT: 11/06/2017 11:09 ADDENDUM: Pathology revealed DIFFUSE LARGE B-CELL LYMPHOMA of LEFT breast, upper outer quadrant, 12:30 o'clock. This was found to be concordant by Dr. Lajean Manes. Pathology revealed DIFFUSE LARGE B-CELL LYMPHOMA, of lymph node, left axilla. This was found to be concordant by Dr. Lajean Manes. Pathology results were discussed with the patient by telephone. The patient reported doing well after the biopsy with tenderness at the site. Post biopsy instructions and care were reviewed and questions were answered. The patient was encouraged to call [facility] for any additional concerns. I have contacted Isaiah Blakes, New Patient Scheduler, with [facility], [place], [place], with referral information and for an appointment with Dr. Sullivan Lone, Oncologist. Seth Bake will contact the patient with an appointment date and time. Pathology results reported by Roselind Messier, RN on 11/06/2017. Electronically Signed   By: Lajean Manes M.D.   On: 11/06/2017 11:09   Result Date: 11/12/2017 CLINICAL DATA:  Patient presents ultrasound-guided core needle biopsy of an upper outer quadrant left breast mass and 1 of the 3 enlarged left axillary lymph nodes. EXAM: ULTRASOUND GUIDED LEFT BREAST CORE NEEDLE BIOPSY ULTRASOUND GUIDED LEFT LEFT AXILLARY LYMPH NODE CORE NEEDLE BIOPSY COMPARISON:  Current diagnostic mammograms and ultrasound. FINDINGS: I met with the patient and we discussed the procedure of ultrasound-guided biopsy, including benefits and alternatives. We discussed the high likelihood of a successful  procedure. We discussed the risks of the procedure, including infection, bleeding, tissue injury, clip migration, and inadequate sampling. Informed written consent was given. The usual time-out protocol was performed immediately prior to the procedure. Lesion #1: 12:30 oclock mass. Lesion quadrant: Upper outer quadrant Using sterile technique and 1% Lidocaine as local anesthetic, under direct ultrasound visualization, a 12 gauge spring-loaded device was used to perform biopsy of the 12:30 o'clock position left breast mass using an inferomedial approach. At the conclusion of the procedure a ribbon shaped tissue marker clip was deployed into the biopsy cavity. Lesion #2:  Left axillary lymph node Using sterile technique and 1% Lidocaine as local anesthetic, under direct ultrasound visualization, a 14 gauge spring-loaded device was used to perform biopsy of the largest of the 3 abnormal left axillary lymph nodes using an inferior approach. At the conclusion of the procedure a HydroMARK tissue marker clip was deployed into the biopsy cavity. Follow up 2 view mammogram was performed and dictated separately. IMPRESSION: Ultrasound guided biopsy of a left breast upper outer quadrant mass and an abnormal left  axillary lymph node. No apparent complications. Electronically Signed: By: Lajean Manes M.D. On: 10/31/2017 13:16    ASSESSMENT & PLAN:  47 y.o. male with  1. Newly diagnosed Diffuse Large B-Cell Lymphoma Presenting with left breast lesion and left axillary Lnadenopathy  10/31/17 Left breast and left axilla LN pathologies which revealed Diffuse Large B-Cell Lymphoma with germinal center subtype and Ki67 of 40%   PLAN: -Discussed that the patient's diagnosis, natural history, treatment approaches and prognosis based on staging. -Discussed the treatment recommendation of R-CHOP and will consider G-CSF support if necessary  -Discussed the recommendation to avoid crowds and avoid individuals with infections  while receiving chemotherapy -Discussed pt labwork today, 11/27/17; ANC at 8.3k, blood counts otherwise normal, Creatinine at 1.31, chemistries otherwise normal. LDH at 208 -11/14/17 Hep B, Hep C, and HIV were all negative  -Discussed the 11/25/17 PET/CT which revealed Hypermetabolic left axillary adenopathy and outer left breast mass, compatible with biopsy-proven lymphoma. 2. Hypermetabolic bilateral thyroid lobe nodules and hypermetabolic left levels 4 and 7 neck lymph nodes. These findings could all be due to involvement by lymphoma, although the possibility of a second primary malignancy of the thyroid cannot be excluded. 3. No additional hypermetabolic sites of disease. Normal size and activity of the spleen. 4. Right adrenal adenoma. -Discussed the 11/21/17 Brain MRI which revealed Normal examination.  No evidence of malignant involvement.  -Discussed the 11/21/17 ECHO which revealed a LV EF of 55%-60% -Thyroid nodules could be related to DLBCL or a second primary cancer -US guided thyroid gland and neck lymph node biopsy -Will order thyroid function panel as well  -FISH panel is pending  -Will likely recommend 6 cycles of R-CHOP, though will discuss staging further with radiologist for consideration of 3 cycles of R-CHOP and RT -Stage IV low bulk vs Stage II non-contiguous  -Pt does not meet high-risk criteria and do not recommend prophylactic intrathecal methotrexate unless appears to be double hit lymphoma -Recommended that the pt continue to eat well, drink at least 48-64 oz of water each day, and walk 20-30 minutes each day.   -Will give pt flu shot today -Proceed with port placement tomorrow  -Will see the pt back 7-10 days after C1 R-CHOP for toxicity check   . Orders Placed This Encounter  Procedures  . CBC with Differential/Platelet    Standing Status:   Future    Standing Expiration Date:   01/01/2019  . CMP ([place] only)    Standing Status:   Future    Standing Expiration  Date:   11/28/2018  . Uric acid    Standing Status:   Future    Standing Expiration Date:   11/27/2018  . Lactate dehydrogenase    Standing Status:   Future    Standing Expiration Date:   11/28/2018    -Influenza vaccine tomorrow after port a cath placement -US guided thyroid and cervical LN biopsy on Monday 12/01/2017 -R-CHOP to start on 12/03/2017 RTC with Dr Irene Limbo in 10 days post R-CHOP C1 with labs for toxicity check on 12/15/2017   All of the patients questions were answered with apparent satisfaction. The patient knows to call the clinic with any problems, questions or concerns.  The total time spent in the appt was 45 minutes and more than 50% was on counseling and direct patient cares.    Sullivan Lone MD [place] AAHIVMS [facility] Hematology/Oncology Physician [facility]  (Office):       [phone] (Work cell):  [phone] (Fax):           [phone]  11/27/2017 4:20 PM  I, Baldwin [place], am acting as a scribe for Dr. Irene Limbo  .I have reviewed the above documentation for accuracy and completeness, and I agree with the above. Brunetta Genera MD

## 2017-11-27 NOTE — Progress Notes (Signed)
START ON PATHWAY REGIMEN - Lymphoma and CLL     A cycle is every 21 days:     Prednisone      Rituximab      Cyclophosphamide      Doxorubicin      Vincristine   **Always confirm dose/schedule in your pharmacy ordering system**  Patient Characteristics: Diffuse Large B-Cell Lymphoma, First Line, Stage III and IV Disease Type: Not Applicable Disease Type: Diffuse Large B-Cell Lymphoma Disease Type: Not Applicable Line of therapy: First Line [place] Stage: IV Intent of Therapy: Curative Intent, Discussed with Patient 

## 2017-11-28 ENCOUNTER — Encounter (HOSPITAL_COMMUNITY): Payer: Self-pay

## 2017-11-28 ENCOUNTER — Ambulatory Visit (HOSPITAL_COMMUNITY)
Admission: RE | Admit: 2017-11-28 | Discharge: 2017-11-28 | Disposition: A | Discharge status: Home / Self Care | Payer: 59 | Source: Ambulatory Visit

## 2017-11-28 ENCOUNTER — Telehealth: Payer: Self-pay

## 2017-11-28 ENCOUNTER — Ambulatory Visit (HOSPITAL_COMMUNITY)
Admission: RE | Admit: 2017-11-28 | Discharge: 2017-11-28 | Disposition: A | Discharge status: Home / Self Care | Payer: 59 | Source: Ambulatory Visit | Attending: Hematology | Admitting: Hematology

## 2017-11-28 ENCOUNTER — Other Ambulatory Visit: Payer: Self-pay

## 2017-11-28 DIAGNOSIS — Z9889 Other specified postprocedural states: Secondary | ICD-10-CM | POA: Insufficient documentation

## 2017-11-28 DIAGNOSIS — Z809 Family history of malignant neoplasm, unspecified: Secondary | ICD-10-CM | POA: Insufficient documentation

## 2017-11-28 DIAGNOSIS — C8338 Diffuse large B-cell lymphoma, lymph nodes of multiple sites: Secondary | ICD-10-CM | POA: Insufficient documentation

## 2017-11-28 DIAGNOSIS — Z882 Allergy status to sulfonamides status: Secondary | ICD-10-CM | POA: Diagnosis not present

## 2017-11-28 DIAGNOSIS — Z452 Encounter for adjustment and management of vascular access device: Secondary | ICD-10-CM | POA: Diagnosis not present

## 2017-11-28 DIAGNOSIS — Z8249 Family history of ischemic heart disease and other diseases of the circulatory system: Secondary | ICD-10-CM | POA: Diagnosis not present

## 2017-11-28 DIAGNOSIS — I10 Essential (primary) hypertension: Secondary | ICD-10-CM | POA: Diagnosis not present

## 2017-11-28 DIAGNOSIS — E042 Nontoxic multinodular goiter: Secondary | ICD-10-CM | POA: Insufficient documentation

## 2017-11-28 DIAGNOSIS — Z5111 Encounter for antineoplastic chemotherapy: Secondary | ICD-10-CM | POA: Diagnosis not present

## 2017-11-28 DIAGNOSIS — C8330 Diffuse large B-cell lymphoma, unspecified site: Secondary | ICD-10-CM | POA: Diagnosis not present

## 2017-11-28 DIAGNOSIS — Z79899 Other long term (current) drug therapy: Secondary | ICD-10-CM | POA: Insufficient documentation

## 2017-11-28 DIAGNOSIS — E041 Nontoxic single thyroid nodule: Secondary | ICD-10-CM

## 2017-11-28 DIAGNOSIS — E7800 Pure hypercholesterolemia, unspecified: Secondary | ICD-10-CM | POA: Diagnosis not present

## 2017-11-28 HISTORY — DX: Non-Hodgkin lymphoma, unspecified, unspecified site: C85.90

## 2017-11-28 HISTORY — PX: IR IMAGING GUIDED PORT INSERTION: IMG5740

## 2017-11-28 LAB — CBC
HEMATOCRIT: 47.1 % (ref 39.0–52.0)
Hemoglobin: 16.1 g/dL (ref 13.0–17.0)
MCH: 30 pg (ref 26.0–34.0)
MCHC: 34.2 g/dL (ref 30.0–36.0)
MCV: 87.7 fL (ref 78.0–100.0)
Platelets: 223 10*3/uL (ref 150–400)
RBC: 5.37 MIL/uL (ref 4.22–5.81)
RDW: 13.4 % (ref 11.5–15.5)
WBC: 6.8 10*3/uL (ref 4.0–10.5)

## 2017-11-28 LAB — PROTIME-INR
INR: 0.96
Prothrombin Time: 12.6 seconds (ref 11.4–15.2)

## 2017-11-28 LAB — APTT: aPTT: 30 seconds (ref 24–36)

## 2017-11-28 MED ORDER — FENTANYL CITRATE (PF) 100 MCG/2ML IJ SOLN
INTRAMUSCULAR | Status: AC | PRN
  Administered 2017-11-28 (×2): 50 ug via INTRAVENOUS

## 2017-11-28 MED ORDER — HEPARIN SOD (PORK) LOCK FLUSH 100 UNIT/ML IV SOLN
INTRAVENOUS | Status: AC
  Filled 2017-11-28: qty 5

## 2017-11-28 MED ORDER — HEPARIN SOD (PORK) LOCK FLUSH 100 UNIT/ML IV SOLN
INTRAVENOUS | Status: AC | PRN
  Administered 2017-11-28: 500 [IU] via INTRAVENOUS

## 2017-11-28 MED ORDER — MIDAZOLAM HCL 2 MG/2ML IJ SOLN
INTRAMUSCULAR | Status: AC
  Filled 2017-11-28: qty 2

## 2017-11-28 MED ORDER — SODIUM CHLORIDE 0.9 % IV SOLN
INTRAVENOUS | Status: DC
  Administered 2017-11-28 (×2): via INTRAVENOUS

## 2017-11-28 MED ORDER — LIDOCAINE-EPINEPHRINE (PF) 2 %-1:200000 IJ SOLN
INTRAMUSCULAR | Status: AC
  Filled 2017-11-28: qty 20

## 2017-11-28 MED ORDER — MIDAZOLAM HCL 2 MG/2ML IJ SOLN
INTRAMUSCULAR | Status: AC | PRN
  Administered 2017-11-28 (×3): 1 mg via INTRAVENOUS

## 2017-11-28 MED ORDER — FENTANYL CITRATE (PF) 100 MCG/2ML IJ SOLN
INTRAMUSCULAR | Status: AC
  Filled 2017-11-28: qty 2

## 2017-11-28 MED ORDER — LIDOCAINE HCL 1 % IJ SOLN
INTRAMUSCULAR | Status: AC | PRN
  Administered 2017-11-28: 15 mL

## 2017-11-28 MED ORDER — CEFAZOLIN SODIUM-DEXTROSE 2-4 GM/100ML-% IV SOLN
2.0000 g | Freq: Once | INTRAVENOUS | Status: AC
  Administered 2017-11-28: 2 g via INTRAVENOUS
  Filled 2017-11-28: qty 100

## 2017-11-28 NOTE — Consult Note (Signed)
Chief Complaint: Patient was seen in consultation today for port a cath placement  Referring Physician(s): Brunetta Genera  Supervising Physician: Arne Cleveland  Patient Status: [facility] - Out-pt  History of Present Illness: Alex Mendoza is a 47 y.o. male with history of newly diagnosed diffuse large B-cell lymphoma who presents today for Port-A-Cath placement for chemotherapy.  Past Medical History:  Diagnosis Date  . Bradycardia 07/20/2015  . Hypercholesteremia   . Hypertension   . Palpitations 07/20/2015    Past Surgical History:  Procedure Laterality Date  . DG GALL BLADDER    . KNEE SURGERY Right   . SHOULDER SURGERY Left     Allergies: Sulfa antibiotics  Medications: Prior to Admission medications   Medication Sig Start Date End Date Taking? Authorizing Provider  busPIRone (BUSPAR) 5 MG tablet Take 5 mg by mouth 2 (two) times daily.    [provider]  lidocaine-prilocaine (EMLA) cream Apply to affected area once 11/27/17   Brunetta Genera, MD  LORazepam (ATIVAN) 0.5 MG tablet Take 1 tablet (0.5 mg total) by mouth every 6 (six) hours as needed (Nausea or vomiting). 11/27/17   Brunetta Genera, MD  ondansetron (ZOFRAN) 8 MG tablet Take 1 tablet (8 mg total) by mouth 2 (two) times daily as needed for refractory nausea / vomiting. Start on day 3 after chemotherapy. 11/27/17   Brunetta Genera, MD  predniSONE (DELTASONE) 20 MG tablet Take 3 tablets (60 mg total) by mouth daily. Take on days 1-5 of chemotherapy. 11/27/17   Brunetta Genera, MD  prochlorperazine (COMPAZINE) 10 MG tablet Take 1 tablet (10 mg total) by mouth every 6 (six) hours as needed (Nausea or vomiting). 11/27/17   Brunetta Genera, MD  quinapril (ACCUPRIL) 20 MG tablet Take 10 mg by mouth every morning.    [provider]  rosuvastatin (CRESTOR) 10 MG tablet Take 10 mg by mouth daily.    [provider]     Family History  Problem Relation Age of  Onset  . Atrial fibrillation Mother   . Heart failure Maternal Grandmother   . Heart failure Paternal Grandmother   . Bradycardia Father   . Cancer Paternal Aunt     Social History   Socioeconomic History  . Marital status: Married    Spouse name: Margaretha Sheffield  . Number of children: 2  . Years of education: 56  . Highest education level: Not on file  Occupational History    Comment: [employer]  Social Needs  . Financial resource strain: Not on file  . Food insecurity:    Worry: Not on file    Inability: Not on file  . Transportation needs:    Medical: Not on file    Non-medical: Not on file  Tobacco Use  . Smoking status: Never Smoker  . Smokeless tobacco: Never Used  Substance and Sexual Activity  . Alcohol use: No    Alcohol/week: 0.0 standard drinks  . Drug use: No  . Sexual activity: Not on file  Lifestyle  . Physical activity:    Days per week: Not on file    Minutes per session: Not on file  . Stress: Not on file  Relationships  . Social connections:    Talks on phone: Not on file    Gets together: Not on file    Attends religious service: Not on file    Active member of club or organization: Not on file    Attends meetings of clubs or organizations:  Not on file    Relationship status: Not on file  Other Topics Concern  . Not on file  Social History Narrative   Lives with wife, family   Caffeine- some chocolate      Review of Systems denies fever, chest pain, dyspnea, cough, abdominal/back pain, nausea, vomiting or bleeding.  He does have occasional headaches and is anxious.  Vital Signs:pend   Physical Exam awake, alert.  Chest clear to auscultation bilaterally.  Heart with regular rate and rhythm.  Abdomen soft, positive bowel sounds, nontender.  No lower extremity edema.  Imaging: Mr Jeri Cos YF Contrast  Result Date: 11/21/2017 CLINICAL DATA:  Diffuse large B cell lymphoma of the left breast. EXAM: MRI HEAD WITHOUT AND WITH CONTRAST TECHNIQUE:  Multiplanar, multiecho pulse sequences of the brain and surrounding structures were obtained without and with intravenous contrast. CONTRAST:  7 cc Gadovist COMPARISON:  05/01/2016 FINDINGS: Brain: Brain has normal appearance without evidence of malformation, atrophy, old or acute small or large vessel infarction, mass lesion, hemorrhage, hydrocephalus or extra-axial collection. After contrast administration, no abnormal enhancement occurs. Vascular: Major vessels at the base of the brain show flow. Venous sinuses appear patent. Skull and upper cervical spine: Normal. Sinuses/Orbits: Clear/normal. Other: None significant. IMPRESSION: Normal examination.  No evidence of malignant involvement. Electronically Signed   By: Nelson Chimes M.D.   On: 11/21/2017 20:05   Nm Pet Image Initial (pi) Skull Base To Thigh  Result Date: 11/26/2017 CLINICAL DATA:  Initial treatment strategy for diffuse large B-cell lymphoma. EXAM: NUCLEAR MEDICINE PET SKULL BASE TO THIGH TECHNIQUE: 10.4 mCi F-18 FDG was injected intravenously. Full-ring PET imaging was performed from the skull base to thigh after the radiotracer. CT data was obtained and used for attenuation correction and anatomic localization. Fasting blood glucose: 91 mg/dl COMPARISON:  None. FINDINGS: Mediastinal blood pool activity: SUV max 2.3 NECK: There are hypermetabolic bilateral thyroid lobe hypodense nodules, largest 1.5 cm in upper left thyroid lobe with max SUV 28.1 (series 4/image 40) and 1.3 cm with max SUV 18.4 in the lower right thyroid lobe (series 4/image 52). There is a hypermetabolic calcified 0.7 cm left level 7 (delphian) neck lymph node posterior and inferior to the left thyroid lobe with max SUV 5.3 (series 4/image 54). There are clustered hypermetabolic left level 4 neck lymph nodes, largest 1.2 cm with max SUV 6.5 (series 4/image 52). Incidental CT findings: none CHEST: Hypermetabolic 1.3 cm subcutaneous outer left breast soft tissue mass with max SUV  8.1 (series 4/image 67), containing internal biopsy clip. Moderate hypermetabolic left axillary adenopathy, with dominant 2.3 cm left axillary node with max SUV 21.9 (series 4/image 64). No hypermetabolic right axillary, mediastinal or hilar nodes. No hypermetabolic pulmonary findings. Incidental CT findings: No acute consolidative airspace disease, lung masses or significant pulmonary nodules. ABDOMEN/PELVIS: No abnormal hypermetabolic activity within the liver, pancreas, adrenal glands, or spleen. No hypermetabolic lymph nodes in the abdomen or pelvis. Incidental CT findings: Cholecystectomy. Right adrenal 2.5 cm adenoma with density 3 HU. SKELETON: No focal hypermetabolic activity to suggest skeletal metastasis. Incidental CT findings: Soft tissue anchors are noted in the right glenoid. IMPRESSION: 1. Hypermetabolic left axillary adenopathy and outer left breast mass, compatible with biopsy-proven lymphoma. 2. Hypermetabolic bilateral thyroid lobe nodules and hypermetabolic left levels 4 and 7 neck lymph nodes. These findings could all be due to involvement by lymphoma, although the possibility of a second primary malignancy of the thyroid cannot be excluded. 3. No additional hypermetabolic sites of disease.  Normal size and activity of the spleen. 4. Right adrenal adenoma. Electronically Signed   By: Ilona Sorrel M.D.   On: 11/26/2017 09:36   US Breast Ltd Uni Left Inc Axilla  Result Date: 10/31/2017 CLINICAL DATA:  Patient presents with a palpable left breast mass, which he says has been present for between 1 and 2 years. More recently, patient has a palpable a lump in the left axilla. EXAM: DIGITAL DIAGNOSTIC BILATERAL MAMMOGRAM WITH CAD AND TOMO ULTRASOUND LEFT BREAST COMPARISON:  None ACR Breast Density Category a: The breast tissue is almost entirely fatty. FINDINGS: The that there is an irregular mass in the upper outer left breast corresponding to the palpable abnormality in the. There is a smaller,  adjacent irregular mass. No other breast masses. Mammographic images were processed with CAD. On physical exam, there is a firm palpable mass in the upper outer left breast. There is a soft palpable lump in the left axilla. Targeted ultrasound is performed, showing an irregular hypoechoic mass in the left breast at 12:30 o'clock, 7 cm the nipple, measuring 13 x 12 x 14 mm. The mass is vascular with prominent blood flow on color Doppler analysis. Peripheral to this, at the 1 o'clock position, 8 cm the nipple, there is a small irregular hypoechoic mass measuring 5 x 3 x 4 mm. In the left axilla, there are 3 discrete enlarged/abnormal lymph nodes. The largest node measures 4.3 x 1.9 x 3.5 cm. IMPRESSION: 1. Left breast mass in which is highly suspicious for breast malignancy. There is a smaller adjacent mass that is consistent with a satellite lesion. There are 3 discrete abnormal/enlarged left axillary lymph nodes consistent with metastatic disease. RECOMMENDATION: 1. Ultrasound-guided core needle biopsy of the primary left breast mass on the largest left axillary lymph nodes. This will be performed today. I have discussed the findings and recommendations with the patient. Results were also provided in writing at the conclusion of the visit. If applicable, a reminder letter will be sent to the patient regarding the next appointment. BI-RADS CATEGORY  5: Highly suggestive of malignancy. Electronically Signed   By: Lajean Manes M.D.   On: 10/31/2017 12:31   Mm Diag Breast Tomo Bilateral  Result Date: 10/31/2017 CLINICAL DATA:  Patient presents with a palpable left breast mass, which he says has been present for between 1 and 2 years. More recently, patient has a palpable a lump in the left axilla. EXAM: DIGITAL DIAGNOSTIC BILATERAL MAMMOGRAM WITH CAD AND TOMO ULTRASOUND LEFT BREAST COMPARISON:  None ACR Breast Density Category a: The breast tissue is almost entirely fatty. FINDINGS: The that there is an irregular  mass in the upper outer left breast corresponding to the palpable abnormality in the. There is a smaller, adjacent irregular mass. No other breast masses. Mammographic images were processed with CAD. On physical exam, there is a firm palpable mass in the upper outer left breast. There is a soft palpable lump in the left axilla. Targeted ultrasound is performed, showing an irregular hypoechoic mass in the left breast at 12:30 o'clock, 7 cm the nipple, measuring 13 x 12 x 14 mm. The mass is vascular with prominent blood flow on color Doppler analysis. Peripheral to this, at the 1 o'clock position, 8 cm the nipple, there is a small irregular hypoechoic mass measuring 5 x 3 x 4 mm. In the left axilla, there are 3 discrete enlarged/abnormal lymph nodes. The largest node measures 4.3 x 1.9 x 3.5 cm. IMPRESSION: 1. Left breast mass  in which is highly suspicious for breast malignancy. There is a smaller adjacent mass that is consistent with a satellite lesion. There are 3 discrete abnormal/enlarged left axillary lymph nodes consistent with metastatic disease. RECOMMENDATION: 1. Ultrasound-guided core needle biopsy of the primary left breast mass on the largest left axillary lymph nodes. This will be performed today. I have discussed the findings and recommendations with the patient. Results were also provided in writing at the conclusion of the visit. If applicable, a reminder letter will be sent to the patient regarding the next appointment. BI-RADS CATEGORY  5: Highly suggestive of malignancy. Electronically Signed   By: Lajean Manes M.D.   On: 10/31/2017 12:31   [place] Axillary Node Core Biopsy Left  Addendum Date: 11/12/2017   ADDENDUM REPORT: 11/12/2017 07:20 ADDENDUM: A consultation has been arranged with Dr Sullivan Lone, Hematology Oncology, with [place], [place], for November 12, 2017. Pathology results reported by Roselind Messier, RN on 11/11/2017. Electronically Signed   By: Lajean Manes M.D.    On: 11/12/2017 07:20   Addendum Date: 11/06/2017   ADDENDUM REPORT: 11/06/2017 11:09 ADDENDUM: Pathology revealed DIFFUSE LARGE B-CELL LYMPHOMA of LEFT breast, upper outer quadrant, 12:30 o'clock. This was found to be concordant by Dr. Lajean Manes. Pathology revealed DIFFUSE LARGE B-CELL LYMPHOMA, of lymph node, left axilla. This was found to be concordant by Dr. Lajean Manes. Pathology results were discussed with the patient by telephone. The patient reported doing well after the biopsy with tenderness at the site. Post biopsy instructions and care were reviewed and questions were answered. The patient was encouraged to call [facility] for any additional concerns. I have contacted Isaiah Blakes, New Patient Scheduler, with [place], [place], [place], with referral information and for an appointment with Dr. Sullivan Lone, Oncologist. Seth Bake will contact the patient with an appointment date and time. Pathology results reported by Roselind Messier, RN on 11/06/2017. Electronically Signed   By: Lajean Manes M.D.   On: 11/06/2017 11:09   Result Date: 11/12/2017 CLINICAL DATA:  Patient presents ultrasound-guided core needle biopsy of an upper outer quadrant left breast mass and 1 of the 3 enlarged left axillary lymph nodes. EXAM: ULTRASOUND GUIDED LEFT BREAST CORE NEEDLE BIOPSY ULTRASOUND GUIDED LEFT LEFT AXILLARY LYMPH NODE CORE NEEDLE BIOPSY COMPARISON:  Current diagnostic mammograms and ultrasound. FINDINGS: I met with the patient and we discussed the procedure of ultrasound-guided biopsy, including benefits and alternatives. We discussed the high likelihood of a successful procedure. We discussed the risks of the procedure, including infection, bleeding, tissue injury, clip migration, and inadequate sampling. Informed written consent was given. The usual time-out protocol was performed immediately prior to the procedure. Lesion #1: 12:30 oclock mass. Lesion quadrant: Upper  outer quadrant Using sterile technique and 1% Lidocaine as local anesthetic, under direct ultrasound visualization, a 12 gauge spring-loaded device was used to perform biopsy of the 12:30 o'clock position left breast mass using an inferomedial approach. At the conclusion of the procedure a ribbon shaped tissue marker clip was deployed into the biopsy cavity. Lesion #2:  Left axillary lymph node Using sterile technique and 1% Lidocaine as local anesthetic, under direct ultrasound visualization, a 14 gauge spring-loaded device was used to perform biopsy of the largest of the 3 abnormal left axillary lymph nodes using an inferior approach. At the conclusion of the procedure a HydroMARK tissue marker clip was deployed into the biopsy cavity. Follow up 2 view mammogram was performed and dictated  separately. IMPRESSION: Ultrasound guided biopsy of a left breast upper outer quadrant mass and an abnormal left axillary lymph node. No apparent complications. Electronically Signed: By: Lajean Manes M.D. On: 10/31/2017 13:16   Mm Clip Placement Left  Result Date: 10/31/2017 CLINICAL DATA:  Status post ultrasound-guided left breast core needle biopsy of left axillary lymph node biopsy EXAM: DIAGNOSTIC LEFT MAMMOGRAM POST ULTRASOUND BIOPSY COMPARISON:  Previous exam(s). FINDINGS: Mammographic images were obtained following ultrasound guided biopsy of an upper outer quadrant left breast mass and an enlarged left axillary lymph node. The ribbon shaped clip lies within the posterosuperior aspect of the breast mass. HydroMARK clip and the enlarged left axillary lymph node are beyond the mammographic feel dizzy view. The lymph node clip was well seen sonographically deploying within the mass. IMPRESSION: Well-positioned ribbon shaped biopsy clip within the left breast upper outer quadrant mass. Final Assessment: Post Procedure Mammograms for Marker Placement Electronically Signed   By: Lajean Manes M.D.   On: 10/31/2017 13:24    [place] Lt Breast Bx W Loc Dev 1st Lesion Img Bx Spec US Guide  Addendum Date: 11/12/2017   ADDENDUM REPORT: 11/12/2017 07:20 ADDENDUM: A consultation has been arranged with Dr Sullivan Lone, Hematology Oncology, with [place], [place], for November 12, 2017. Pathology results reported by Roselind Messier, RN on 11/11/2017. Electronically Signed   By: Lajean Manes M.D.   On: 11/12/2017 07:20   Addendum Date: 11/06/2017   ADDENDUM REPORT: 11/06/2017 11:09 ADDENDUM: Pathology revealed DIFFUSE LARGE B-CELL LYMPHOMA of LEFT breast, upper outer quadrant, 12:30 o'clock. This was found to be concordant by Dr. Lajean Manes. Pathology revealed DIFFUSE LARGE B-CELL LYMPHOMA, of lymph node, left axilla. This was found to be concordant by Dr. Lajean Manes. Pathology results were discussed with the patient by telephone. The patient reported doing well after the biopsy with tenderness at the site. Post biopsy instructions and care were reviewed and questions were answered. The patient was encouraged to call [facility] for any additional concerns. I have contacted Isaiah Blakes, New Patient Scheduler, with [place], [place], [place], with referral information and for an appointment with Dr. Sullivan Lone, Oncologist. Seth Bake will contact the patient with an appointment date and time. Pathology results reported by Roselind Messier, RN on 11/06/2017. Electronically Signed   By: Lajean Manes M.D.   On: 11/06/2017 11:09   Result Date: 11/12/2017 CLINICAL DATA:  Patient presents ultrasound-guided core needle biopsy of an upper outer quadrant left breast mass and 1 of the 3 enlarged left axillary lymph nodes. EXAM: ULTRASOUND GUIDED LEFT BREAST CORE NEEDLE BIOPSY ULTRASOUND GUIDED LEFT LEFT AXILLARY LYMPH NODE CORE NEEDLE BIOPSY COMPARISON:  Current diagnostic mammograms and ultrasound. FINDINGS: I met with the patient and we discussed the procedure of ultrasound-guided biopsy,  including benefits and alternatives. We discussed the high likelihood of a successful procedure. We discussed the risks of the procedure, including infection, bleeding, tissue injury, clip migration, and inadequate sampling. Informed written consent was given. The usual time-out protocol was performed immediately prior to the procedure. Lesion #1: 12:30 oclock mass. Lesion quadrant: Upper outer quadrant Using sterile technique and 1% Lidocaine as local anesthetic, under direct ultrasound visualization, a 12 gauge spring-loaded device was used to perform biopsy of the 12:30 o'clock position left breast mass using an inferomedial approach. At the conclusion of the procedure a ribbon shaped tissue marker clip was deployed into the biopsy cavity. Lesion #2:  Left axillary lymph node Using  sterile technique and 1% Lidocaine as local anesthetic, under direct ultrasound visualization, a 14 gauge spring-loaded device was used to perform biopsy of the largest of the 3 abnormal left axillary lymph nodes using an inferior approach. At the conclusion of the procedure a HydroMARK tissue marker clip was deployed into the biopsy cavity. Follow up 2 view mammogram was performed and dictated separately. IMPRESSION: Ultrasound guided biopsy of a left breast upper outer quadrant mass and an abnormal left axillary lymph node. No apparent complications. Electronically Signed: By: Lajean Manes M.D. On: 10/31/2017 13:16    Labs:  CBC: Recent Labs    11/27/17 1343  WBC 10.5*  HGB 15.0  HCT 45.0  PLT 216    COAGS: No results for input(s): INR, APTT in the last 8760 hours.  BMP: Recent Labs    11/14/17 1448 11/27/17 1343  NA 142 142  K 4.2 4.4  CL 108 106  CO2 24 27  GLUCOSE 94 101*  BUN 19 20  CALCIUM 9.4 9.4  CREATININE 1.11 1.31*  GFRNONAA >60 >60  GFRAA >60 >60    LIVER FUNCTION TESTS: Recent Labs    11/14/17 1448 11/27/17 1343  BILITOT 0.7 0.7  AST 51* 19  ALT 50* 21  ALKPHOS 57 65  PROT 7.1  7.2  ALBUMIN 4.1 4.2    TUMOR MARKERS: No results for input(s): AFPTM, CEA, CA199, CHROMGRNA in the last 8760 hours.  Assessment and Plan: 47 y.o. male with history of newly diagnosed diffuse large B-cell lymphoma who presents today for Port-A-Cath placement for chemotherapy.Risks and benefits of image guided port-a-catheter placement was discussed with the patient including, but not limited to bleeding, infection, pneumothorax, or fibrin sheath development and need for additional procedures.  All of the patient's questions were answered, patient is agreeable to proceed. Consent signed and in chart.  LABS PENDING    Thank you for this interesting consult.  I greatly enjoyed meeting [facility] and look forward to participating in their care.  A copy of this report was sent to the requesting provider on this date.  Electronically Signed: D. Rowe Robert, PA-C 11/28/2017, 10:19 AM   I spent a total of  25 minutes   in face to face in clinical consultation, greater than 50% of which was counseling/coordinating care for port a cath placement

## 2017-11-28 NOTE — Procedures (Signed)
  Procedure: R IJ port   EBL:   minimal Complications:  none immediate  See full dictation in [facility] PACS.  D. Elizebeth Kluesner MD Main # [phone] Pager  [phone]    

## 2017-11-28 NOTE — Progress Notes (Signed)
Patient had a port placed in IR today. Extended recovery as he has 1:30 PM appointment for [place] of his thyroid. Cannot discharge patient until this test is completed.

## 2017-11-28 NOTE — Telephone Encounter (Signed)
Spoke with Threasa Beards and she added a additional 30 min. Unto the patient current sschedule to cover lab result time. Per 9/19 los follow up

## 2017-11-28 NOTE — Discharge Instructions (Addendum)
Implanted Port Home Guide °An implanted port is a type of central line that is placed under the skin. Central lines are used to provide IV access when treatment or nutrition needs to be given through a person’s veins. Implanted ports are used for long-term IV access. An implanted port may be placed because: °· You need IV medicine that would be irritating to the small veins in your hands or arms. °· You need long-term IV medicines, such as antibiotics. °· You need IV nutrition for a long period. °· You need frequent blood draws for lab tests. °· You need dialysis. ° °Implanted ports are usually placed in the chest area, but they can also be placed in the upper arm, the abdomen, or the leg. An implanted port has two main parts: °· Reservoir. The reservoir is round and will appear as a small, raised area under your skin. The reservoir is the part where a needle is inserted to give medicines or draw blood. °· Catheter. The catheter is a thin, flexible tube that extends from the reservoir. The catheter is placed into a large vein. Medicine that is inserted into the reservoir goes into the catheter and then into the vein. ° °How will I care for my incision site? °Do not get the incision site wet. Bathe or shower as directed by your health care provider. °How is my port accessed? °Special steps must be taken to access the port: °· Before the port is accessed, a numbing cream can be placed on the skin. This helps numb the skin over the port site. °· Your health care provider uses a sterile technique to access the port. °? Your health care provider must put on a mask and sterile gloves. °? The skin over your port is cleaned carefully with an antiseptic and allowed to dry. °? The port is gently pinched between sterile gloves, and a needle is inserted into the port. °· Only "non-coring" port needles should be used to access the port. Once the port is accessed, a blood return should be checked. This helps ensure that the port  is in the vein and is not clogged. °· If your port needs to remain accessed for a constant infusion, a clear (transparent) bandage will be placed over the needle site. The bandage and needle will need to be changed every week, or as directed by your health care provider. °· Keep the bandage covering the needle clean and dry. Do not get it wet. Follow your health care provider’s instructions on how to take a shower or bath while the port is accessed. °· If your port does not need to stay accessed, no bandage is needed over the port. ° °What is flushing? °Flushing helps keep the port from getting clogged. Follow your health care provider’s instructions on how and when to flush the port. Ports are usually flushed with saline solution or a medicine called heparin. The need for flushing will depend on how the port is used. °· If the port is used for intermittent medicines or blood draws, the port will need to be flushed: °? After medicines have been given. °? After blood has been drawn. °? As part of routine maintenance. °· If a constant infusion is running, the port may not need to be flushed. ° °How long will my port stay implanted? °The port can stay in for as long as your health care provider thinks it is needed. When it is time for the port to come out, surgery will be   done to remove it. The procedure is similar to the one performed when the port was put in. °When should I seek immediate medical care? °When you have an implanted port, you should seek immediate medical care if: °· You notice a bad smell coming from the incision site. °· You have swelling, redness, or drainage at the incision site. °· You have more swelling or pain at the port site or the surrounding area. °· You have a fever that is not controlled with medicine. ° °This information is not intended to replace advice given to you by your health care provider. Make sure you discuss any questions you have with your health care provider. °Document  Released: 02/25/2005 Document Revised: 08/03/2015 Document Reviewed: 11/02/2012 °[employer] Interactive Patient Education © 2017 [employer] °Moderate Conscious Sedation, Adult, Care After °These instructions provide you with information about caring for yourself after your procedure. Your health care provider may also give you more specific instructions. Your treatment has been planned according to current medical practices, but problems sometimes occur. Call your health care provider if you have any problems or questions after your procedure. °What can I expect after the procedure? °After your procedure, it is common: °· To feel sleepy for several hours. °· To feel clumsy and have poor balance for several hours. °· To have poor judgment for several hours. °· To vomit if you eat too soon. ° °Follow these instructions at home: °For at least 24 hours after the procedure: ° °· Do not: °? Participate in activities where you could fall or become injured. °? Drive. °? Use heavy machinery. °? Drink alcohol. °? Take sleeping pills or medicines that cause drowsiness. °? Make important decisions or sign legal documents. °? Take care of children on your own. °· Rest. °Eating and drinking °· Follow the diet recommended by your health care provider. °· If you vomit: °? Drink water, juice, or soup when you can drink without vomiting. °? Make sure you have little or no nausea before eating solid foods. °General instructions °· Have a responsible adult stay with you until you are awake and alert. °· Take over-the-counter and prescription medicines only as told by your health care provider. °· If you smoke, do not smoke without supervision. °· Keep all follow-up visits as told by your health care provider. This is important. °Contact a health care provider if: °· You keep feeling nauseous or you keep vomiting. °· You feel light-headed. °· You develop a rash. °· You have a fever. °Get help right away if: °· You have trouble  breathing. °This information is not intended to replace advice given to you by your health care provider. Make sure you discuss any questions you have with your health care provider. °Document Released: 12/16/2012 Document Revised: 07/31/2015 Document Reviewed: 06/17/2015 °[employer] Interactive Patient Education © 2018 [employer] ° °

## 2017-11-28 NOTE — Telephone Encounter (Signed)
Called patient to inform him of upcoming appointments that was scheduled for him. Per 9/19 los. Will mail a copy of the schedule with a calender enclosed.

## 2017-12-01 ENCOUNTER — Encounter (HOSPITAL_COMMUNITY): Payer: Self-pay

## 2017-12-01 ENCOUNTER — Encounter: Payer: Self-pay | Admitting: Hematology

## 2017-12-01 ENCOUNTER — Other Ambulatory Visit: Payer: Self-pay | Admitting: Hematology

## 2017-12-01 ENCOUNTER — Telehealth: Payer: Self-pay

## 2017-12-01 DIAGNOSIS — E041 Nontoxic single thyroid nodule: Secondary | ICD-10-CM

## 2017-12-01 NOTE — Telephone Encounter (Signed)
Pt called and LVM this morning to ask about redundancy of appts and use of emla cream. Called pt back and explained that he will go to the lab to acquire lab tubes and one of our nurses with access his port and draw labs. Pt should not use emla cream for two weeks following port placement. He can use ice on port this week prior to access.

## 2017-12-01 NOTE — Telephone Encounter (Signed)
Pt has not had success scheduling thyroid biopsy yet. Called this morning to speak with [facility] Sched[facility] and was transferred to biopsy. Promised to contact Radiologist this morning to begin the process. Order placed for stat biospy in the afternoon on 11/27/17. Pt encouraged to call back if needing assistance.

## 2017-12-02 ENCOUNTER — Telehealth: Payer: Self-pay | Admitting: *Deleted

## 2017-12-02 NOTE — Telephone Encounter (Signed)
Pt called to clarify directions for Prednisone to be taken day 1-5 of chemotherapy. His first day of chemotherapy is Wednesday 12/03/17.Instructed to take predisone beginning tomorrow 12/03/17 and for the next 4 days.  Given fax number and contact number for [employer] for further information regarding FMLA paperwork.

## 2017-12-03 ENCOUNTER — Inpatient Hospital Stay: Payer: 59

## 2017-12-03 ENCOUNTER — Other Ambulatory Visit: Payer: 59

## 2017-12-03 ENCOUNTER — Other Ambulatory Visit: Payer: Self-pay

## 2017-12-03 VITALS — BP 115/77 | HR 69 | Temp 98.2°F | Resp 18

## 2017-12-03 DIAGNOSIS — C8338 Diffuse large B-cell lymphoma, lymph nodes of multiple sites: Secondary | ICD-10-CM | POA: Diagnosis not present

## 2017-12-03 LAB — CMP (CANCER CENTER ONLY)
ALK PHOS: 62 U/L (ref 38–126)
ALT: 25 U/L (ref 0–44)
AST: 21 U/L (ref 15–41)
Albumin: 3.9 g/dL (ref 3.5–5.0)
Anion gap: 9 (ref 5–15)
BUN: 21 mg/dL — ABNORMAL HIGH (ref 6–20)
CALCIUM: 9 mg/dL (ref 8.9–10.3)
CHLORIDE: 106 mmol/L (ref 98–111)
CO2: 25 mmol/L (ref 22–32)
CREATININE: 0.92 mg/dL (ref 0.61–1.24)
GFR, Est AFR Am: >60 mL/min (ref >60)
GFR, Estimated: >60 mL/min (ref >60)
GLUCOSE: 126 mg/dL — AB (ref 70–99)
Potassium: 4.1 mmol/L (ref 3.5–5.1)
SODIUM: 140 mmol/L (ref 135–145)
Total Bilirubin: 0.8 mg/dL (ref 0.3–1.2)
Total Protein: 7 g/dL (ref 6.5–8.1)

## 2017-12-03 LAB — CBC WITH DIFFERENTIAL/PLATELET
BASOS PCT: 1 %
Basophils Absolute: 0 10*3/uL (ref 0.0–0.1)
EOS ABS: 0.1 10*3/uL (ref 0.0–0.5)
EOS PCT: 2 %
HCT: 44 % (ref 38.4–49.9)
HEMOGLOBIN: 14.7 g/dL (ref 13.0–17.1)
LYMPHS ABS: 1.3 10*3/uL (ref 0.9–3.3)
Lymphocytes Relative: 17 %
MCH: 29.3 pg (ref 27.2–33.4)
MCHC: 33.4 g/dL (ref 32.0–36.0)
MCV: 87.7 fL (ref 79.3–98.0)
MONOS PCT: 5 %
Monocytes Absolute: 0.4 10*3/uL (ref 0.1–0.9)
Neutro Abs: 5.7 10*3/uL (ref 1.5–6.5)
Neutrophils Relative %: 75 %
PLATELETS: 184 10*3/uL (ref 140–400)
RBC: 5.02 MIL/uL (ref 4.20–5.82)
RDW: 14 % (ref 11.0–14.6)
WBC: 7.6 10*3/uL (ref 4.0–10.3)

## 2017-12-03 LAB — LACTATE DEHYDROGENASE: LDH: 199 U/L — ABNORMAL HIGH (ref 98–192)

## 2017-12-03 LAB — URIC ACID: URIC ACID, SERUM: 4.6 mg/dL (ref 3.7–8.6)

## 2017-12-03 MED ORDER — PALONOSETRON HCL INJECTION 0.25 MG/5ML
0.2500 mg | Freq: Once | INTRAVENOUS | Status: AC
  Administered 2017-12-03: 0.25 mg via INTRAVENOUS

## 2017-12-03 MED ORDER — SODIUM CHLORIDE 0.9 % IV SOLN
375.0000 mg/m2 | Freq: Once | INTRAVENOUS | Status: AC
  Administered 2017-12-03: 800 mg via INTRAVENOUS
  Filled 2017-12-03: qty 50

## 2017-12-03 MED ORDER — HEPARIN SOD (PORK) LOCK FLUSH 100 UNIT/ML IV SOLN
500.0000 [IU] | Freq: Once | INTRAVENOUS | Status: AC | PRN
  Administered 2017-12-03: 500 [IU]
  Filled 2017-12-03: qty 5

## 2017-12-03 MED ORDER — ACETAMINOPHEN 325 MG PO TABS
ORAL_TABLET | ORAL | Status: AC
  Filled 2017-12-03: qty 2

## 2017-12-03 MED ORDER — VINCRISTINE SULFATE CHEMO INJECTION 1 MG/ML
2.0000 mg | Freq: Once | INTRAVENOUS | Status: AC
  Administered 2017-12-03: 2 mg via INTRAVENOUS
  Filled 2017-12-03: qty 2

## 2017-12-03 MED ORDER — SODIUM CHLORIDE 0.9% FLUSH
10.0000 mL | INTRAVENOUS | Status: DC | PRN
  Administered 2017-12-03: 10 mL
  Filled 2017-12-03: qty 10

## 2017-12-03 MED ORDER — SODIUM CHLORIDE 0.9 % IV SOLN
Freq: Once | INTRAVENOUS | Status: AC
  Administered 2017-12-03: 10:00:00 via INTRAVENOUS
  Filled 2017-12-03: qty 5

## 2017-12-03 MED ORDER — SODIUM CHLORIDE 0.9 % IV SOLN
Freq: Once | INTRAVENOUS | Status: AC
  Administered 2017-12-03: 10:00:00 via INTRAVENOUS
  Filled 2017-12-03: qty 250

## 2017-12-03 MED ORDER — ACETAMINOPHEN 325 MG PO TABS
650.0000 mg | ORAL_TABLET | Freq: Once | ORAL | Status: AC
  Administered 2017-12-03: 650 mg via ORAL

## 2017-12-03 MED ORDER — DOXORUBICIN HCL CHEMO IV INJECTION 2 MG/ML
50.0000 mg/m2 | Freq: Once | INTRAVENOUS | Status: AC
  Administered 2017-12-03: 102 mg via INTRAVENOUS
  Filled 2017-12-03: qty 51

## 2017-12-03 MED ORDER — PALONOSETRON HCL INJECTION 0.25 MG/5ML
INTRAVENOUS | Status: AC
  Filled 2017-12-03: qty 5

## 2017-12-03 MED ORDER — SODIUM CHLORIDE 0.9 % IV SOLN
750.0000 mg/m2 | Freq: Once | INTRAVENOUS | Status: AC
  Administered 2017-12-03: 1520 mg via INTRAVENOUS
  Filled 2017-12-03: qty 76

## 2017-12-03 MED ORDER — DIPHENHYDRAMINE HCL 25 MG PO CAPS
50.0000 mg | ORAL_CAPSULE | Freq: Once | ORAL | Status: AC
  Administered 2017-12-03: 50 mg via ORAL

## 2017-12-03 MED ORDER — DIPHENHYDRAMINE HCL 25 MG PO CAPS
ORAL_CAPSULE | ORAL | Status: AC
  Filled 2017-12-03: qty 2

## 2017-12-03 NOTE — Patient Instructions (Signed)
[facility] Discharge Instructions for Patients Receiving Chemotherapy  Today you received the following chemotherapy agents Adriamycin, Vincristine, Cytoxan, and Rituxan  To help prevent nausea and vomiting after your treatment, we encourage you to take your nausea medication as directed   If you develop nausea and vomiting that is not controlled by your nausea medication, call the clinic.   BELOW ARE SYMPTOMS THAT SHOULD BE REPORTED IMMEDIATELY:  *FEVER GREATER THAN 100.5 F  *CHILLS WITH OR WITHOUT FEVER  NAUSEA AND VOMITING THAT IS NOT CONTROLLED WITH YOUR NAUSEA MEDICATION  *UNUSUAL SHORTNESS OF BREATH  *UNUSUAL BRUISING OR BLEEDING  TENDERNESS IN MOUTH AND THROAT WITH OR WITHOUT PRESENCE OF ULCERS  *URINARY PROBLEMS  *BOWEL PROBLEMS  UNUSUAL RASH Items with * indicate a potential emergency and should be followed up as soon as possible.  Feel free to call the clinic should you have any questions or concerns. The clinic phone number is [phone].  Please show the [facility] at check-in to the Emergency Department and triage nurse.   Doxorubicin (Adriamycin) injection What is this medicine? DOXORUBICIN (dox oh ROO bi sin) is a chemotherapy drug. It is used to treat many kinds of cancer like leukemia, lymphoma, neuroblastoma, sarcoma, and Wilms' tumor. It is also used to treat bladder cancer, breast cancer, lung cancer, ovarian cancer, stomach cancer, and thyroid cancer. This medicine may be used for other purposes; ask your health care provider or pharmacist if you have questions. COMMON BRAND NAME(S): Adriamycin, Adriamycin PFS, Adriamycin RDF, Rubex What should I tell my health care provider before I take this medicine? They need to know if you have any of these conditions: -heart disease -history of low blood counts caused by a medicine -liver disease -recent or ongoing radiation therapy -an unusual or allergic reaction to doxorubicin, other  chemotherapy agents, other medicines, foods, dyes, or preservatives -pregnant or trying to get pregnant -breast-feeding How should I use this medicine? This drug is given as an infusion into a vein. It is administered in a hospital or clinic by a specially trained health care professional. If you have pain, swelling, burning or any unusual feeling around the site of your injection, tell your health care professional right away. Talk to your pediatrician regarding the use of this medicine in children. Special care may be needed. Overdosage: If you think you have taken too much of this medicine contact a poison control center or emergency room at once. NOTE: This medicine is only for you. Do not share this medicine with others. What if I miss a dose? It is important not to miss your dose. Call your doctor or health care professional if you are unable to keep an appointment. What may interact with this medicine? This medicine may interact with the following medications: -6-mercaptopurine -paclitaxel -phenytoin -St. John's Wort -trastuzumab -verapamil This list may not describe all possible interactions. Give your health care provider a list of all the medicines, herbs, non-prescription drugs, or dietary supplements you use. Also tell them if you smoke, drink alcohol, or use illegal drugs. Some items may interact with your medicine. What should I watch for while using this medicine? This drug may make you feel generally unwell. This is not uncommon, as chemotherapy can affect healthy cells as well as cancer cells. Report any side effects. Continue your course of treatment even though you feel ill unless your doctor tells you to stop. There is a maximum amount of this medicine you should receive throughout your life.  The amount depends on the medical condition being treated and your overall health. Your doctor will watch how much of this medicine you receive in your lifetime. Tell your doctor if you  have taken this medicine before. You may need blood work done while you are taking this medicine. Your urine may turn red for a few days after your dose. This is not blood. If your urine is dark or brown, call your doctor. In some cases, you may be given additional medicines to help with side effects. Follow all directions for their use. Call your doctor or health care professional for advice if you get a fever, chills or sore throat, or other symptoms of a cold or flu. Do not treat yourself. This drug decreases your body's ability to fight infections. Try to avoid being around people who are sick. This medicine may increase your risk to bruise or bleed. Call your doctor or health care professional if you notice any unusual bleeding. Talk to your doctor about your risk of cancer. You may be more at risk for certain types of cancers if you take this medicine. Do not become pregnant while taking this medicine or for 6 months after stopping it. Women should inform their doctor if they wish to become pregnant or think they might be pregnant. Men should not father a child while taking this medicine and for 6 months after stopping it. There is a potential for serious side effects to an unborn child. Talk to your health care professional or pharmacist for more information. Do not breast-feed an infant while taking this medicine. This medicine has caused ovarian failure in some women and reduced sperm counts in some men This medicine may interfere with the ability to have a child. Talk with your doctor or health care professional if you are concerned about your fertility. What side effects may I notice from receiving this medicine? Side effects that you should report to your doctor or health care professional as soon as possible: -allergic reactions like skin rash, itching or hives, swelling of the face, lips, or tongue -breathing problems -chest pain -fast or irregular heartbeat -low blood counts - this  medicine may decrease the number of white blood cells, red blood cells and platelets. You may be at increased risk for infections and bleeding. -pain, redness, or irritation at site where injected -signs of infection - fever or chills, cough, sore throat, pain or difficulty passing urine -signs of decreased platelets or bleeding - bruising, pinpoint red spots on the skin, black, tarry stools, blood in the urine -swelling of the ankles, feet, hands -tiredness -weakness Side effects that usually do not require medical attention (report to your doctor or health care professional if they continue or are bothersome): -diarrhea -hair loss -mouth sores -nail discoloration or damage -nausea -red colored urine -vomiting This list may not describe all possible side effects. Call your doctor for medical advice about side effects. You may report side effects to FDA at 1-800-FDA-1088. Where should I keep my medicine? This drug is given in a hospital or clinic and will not be stored at home. NOTE: This sheet is a summary. It may not cover all possible information. If you have questions about this medicine, talk to your doctor, pharmacist, or health care provider.  2018 Elsevier/Gold Standard (2015-04-24 11:28:51)   Vincristine injection What is this medicine? VINCRISTINE (vin KRIS teen) is a chemotherapy drug. It slows the growth of cancer cells. This medicine is used to treat many types of  cancer like Hodgkin's disease, leukemia, non-Hodgkin's lymphoma, neuroblastoma (brain cancer), rhabdomyosarcoma, and Wilms' tumor. This medicine may be used for other purposes; ask your health care provider or pharmacist if you have questions. COMMON BRAND NAME(S): Oncovin, Vincasar PFS What should I tell my health care provider before I take this medicine? They need to know if you have any of these conditions: -blood disorders -gout -infection (especially chickenpox, cold sores, or herpes) -kidney  disease -liver disease -lung disease -nervous system disease like Charcot-Marie-Tooth (CMT) -recent or ongoing radiation therapy -an unusual or allergic reaction to vincristine, other chemotherapy agents, other medicines, foods, dyes, or preservatives -pregnant or trying to get pregnant -breast-feeding How should I use this medicine? This drug is given as an infusion into a vein. It is administered in a hospital or clinic by a specially trained health care professional. If you have pain, swelling, burning, or any unusual feeling around the site of your injection, tell your health care professional right away. Talk to your pediatrician regarding the use of this medicine in children. While this drug may be prescribed for selected conditions, precautions do apply. Overdosage: If you think you have taken too much of this medicine contact a poison control center or emergency room at once. NOTE: This medicine is only for you. Do not share this medicine with others. What if I miss a dose? It is important not to miss your dose. Call your doctor or health care professional if you are unable to keep an appointment. What may interact with this medicine? Do not take this medicine with any of the following medications: -itraconazole -mibefradil -voriconazole This medicine may also interact with the following medications: -cyclosporine -erythromycin -fluconazole -ketoconazole -medicines for HIV like delavirdine, efavirenz, nevirapine -medicines for seizures like ethotoin, fosphenotoin, phenytoin -medicines to increase blood counts like filgrastim, pegfilgrastim, sargramostim -other chemotherapy drugs like cisplatin, L-asparaginase, methotrexate, mitomycin, paclitaxel -pegaspargase -vaccines -zalcitabine, ddC Talk to your doctor or health care professional before taking any of these medicines: -acetaminophen -aspirin -ibuprofen -ketoprofen -naproxen This list may not describe all possible  interactions. Give your health care provider a list of all the medicines, herbs, non-prescription drugs, or dietary supplements you use. Also tell them if you smoke, drink alcohol, or use illegal drugs. Some items may interact with your medicine. What should I watch for while using this medicine? Your condition will be monitored carefully while you are receiving this medicine. You will need important blood work done while you are taking this medicine. This drug may make you feel generally unwell. This is not uncommon, as chemotherapy can affect healthy cells as well as cancer cells. Report any side effects. Continue your course of treatment even though you feel ill unless your doctor tells you to stop. In some cases, you may be given additional medicines to help with side effects. Follow all directions for their use. Call your doctor or health care professional for advice if you get a fever, chills or sore throat, or other symptoms of a cold or flu. Do not treat yourself. Avoid taking products that contain aspirin, acetaminophen, ibuprofen, naproxen, or ketoprofen unless instructed by your doctor. These medicines may hide a fever. Do not become pregnant while taking this medicine. Women should inform their doctor if they wish to become pregnant or think they might be pregnant. There is a potential for serious side effects to an unborn child. Talk to your health care professional or pharmacist for more information. Do not breast-feed an infant while taking this medicine. Men  may have a lower sperm count while taking this medicine. Talk to your doctor if you plan to father a child. What side effects may I notice from receiving this medicine? Side effects that you should report to your doctor or health care professional as soon as possible: -allergic reactions like skin rash, itching or hives, swelling of the face, lips, or tongue -breathing problems -confusion or changes in emotions or  moods -constipation -cough -mouth sores -muscle weakness -nausea and vomiting -pain, swelling, redness or irritation at the injection site -pain, tingling, numbness in the hands or feet -problems with balance, talking, walking -seizures -stomach pain -trouble passing urine or change in the amount of urine Side effects that usually do not require medical attention (report to your doctor or health care professional if they continue or are bothersome): -diarrhea -hair loss -jaw pain -loss of appetite This list may not describe all possible side effects. Call your doctor for medical advice about side effects. You may report side effects to FDA at 1-800-FDA-1088. Where should I keep my medicine? This drug is given in a hospital or clinic and will not be stored at home. NOTE: This sheet is a summary. It may not cover all possible information. If you have questions about this medicine, talk to your doctor, pharmacist, or health care provider.  2018 Elsevier/Gold Standard (2007-11-23 17:17:13)     Cyclophosphamide (Cytoxan) injection What is this medicine? CYCLOPHOSPHAMIDE (sye kloe FOSS fa mide) is a chemotherapy drug. It slows the growth of cancer cells. This medicine is used to treat many types of cancer like lymphoma, myeloma, leukemia, breast cancer, and ovarian cancer, to name a few. This medicine may be used for other purposes; ask your health care provider or pharmacist if you have questions. COMMON BRAND NAME(S): Cytoxan, Neosar What should I tell my health care provider before I take this medicine? They need to know if you have any of these conditions: -blood disorders -history of other chemotherapy -infection -kidney disease -liver disease -recent or ongoing radiation therapy -tumors in the bone marrow -an unusual or allergic reaction to cyclophosphamide, other chemotherapy, other medicines, foods, dyes, or preservatives -pregnant or trying to get  pregnant -breast-feeding How should I use this medicine? This drug is usually given as an injection into a vein or muscle or by infusion into a vein. It is administered in a hospital or clinic by a specially trained health care professional. Talk to your pediatrician regarding the use of this medicine in children. Special care may be needed. Overdosage: If you think you have taken too much of this medicine contact a poison control center or emergency room at once. NOTE: This medicine is only for you. Do not share this medicine with others. What if I miss a dose? It is important not to miss your dose. Call your doctor or health care professional if you are unable to keep an appointment. What may interact with this medicine? This medicine may interact with the following medications: -amiodarone -amphotericin B -azathioprine -certain antiviral medicines for HIV or AIDS such as protease inhibitors (e.g., indinavir, ritonavir) and zidovudine -certain blood pressure medications such as benazepril, captopril, enalapril, fosinopril, lisinopril, moexipril, monopril, perindopril, quinapril, ramipril, trandolapril -certain cancer medications such as anthracyclines (e.g., daunorubicin, doxorubicin), busulfan, cytarabine, paclitaxel, pentostatin, tamoxifen, trastuzumab -certain diuretics such as chlorothiazide, chlorthalidone, hydrochlorothiazide, indapamide, metolazone -certain medicines that treat or prevent blood clots like warfarin -certain muscle relaxants such as succinylcholine -cyclosporine -etanercept -indomethacin -medicines to increase blood counts like filgrastim, pegfilgrastim, sargramostim -  medicines used as general anesthesia -metronidazole -natalizumab This list may not describe all possible interactions. Give your health care provider a list of all the medicines, herbs, non-prescription drugs, or dietary supplements you use. Also tell them if you smoke, drink alcohol, or use illegal  drugs. Some items may interact with your medicine. What should I watch for while using this medicine? Visit your doctor for checks on your progress. This drug may make you feel generally unwell. This is not uncommon, as chemotherapy can affect healthy cells as well as cancer cells. Report any side effects. Continue your course of treatment even though you feel ill unless your doctor tells you to stop. Drink water or other fluids as directed. Urinate often, even at night. In some cases, you may be given additional medicines to help with side effects. Follow all directions for their use. Call your doctor or health care professional for advice if you get a fever, chills or sore throat, or other symptoms of a cold or flu. Do not treat yourself. This drug decreases your body's ability to fight infections. Try to avoid being around people who are sick. This medicine may increase your risk to bruise or bleed. Call your doctor or health care professional if you notice any unusual bleeding. Be careful brushing and flossing your teeth or using a toothpick because you may get an infection or bleed more easily. If you have any dental work done, tell your dentist you are receiving this medicine. You may get drowsy or dizzy. Do not drive, use machinery, or do anything that needs mental alertness until you know how this medicine affects you. Do not become pregnant while taking this medicine or for 1 year after stopping it. Women should inform their doctor if they wish to become pregnant or think they might be pregnant. Men should not father a child while taking this medicine and for 4 months after stopping it. There is a potential for serious side effects to an unborn child. Talk to your health care professional or pharmacist for more information. Do not breast-feed an infant while taking this medicine. This medicine may interfere with the ability to have a child. This medicine has caused ovarian failure in some women.  This medicine has caused reduced sperm counts in some men. You should talk with your doctor or health care professional if you are concerned about your fertility. If you are going to have surgery, tell your doctor or health care professional that you have taken this medicine. What side effects may I notice from receiving this medicine? Side effects that you should report to your doctor or health care professional as soon as possible: -allergic reactions like skin rash, itching or hives, swelling of the face, lips, or tongue -low blood counts - this medicine may decrease the number of white blood cells, red blood cells and platelets. You may be at increased risk for infections and bleeding. -signs of infection - fever or chills, cough, sore throat, pain or difficulty passing urine -signs of decreased platelets or bleeding - bruising, pinpoint red spots on the skin, black, tarry stools, blood in the urine -signs of decreased red blood cells - unusually weak or tired, fainting spells, lightheadedness -breathing problems -dark urine -dizziness -palpitations -swelling of the ankles, feet, hands -trouble passing urine or change in the amount of urine -weight gain -yellowing of the eyes or skin Side effects that usually do not require medical attention (report to your doctor or health care professional if they continue  or are bothersome): -changes in nail or skin color -hair loss -missed menstrual periods -mouth sores -nausea, vomiting This list may not describe all possible side effects. Call your doctor for medical advice about side effects. You may report side effects to FDA at 1-800-FDA-1088. Where should I keep my medicine? This drug is given in a hospital or clinic and will not be stored at home. NOTE: This sheet is a summary. It may not cover all possible information. If you have questions about this medicine, talk to your doctor, pharmacist, or health care provider.  2018 Elsevier/Gold  Standard (2012-01-10 16:22:58)   Rituximab (Rituxan) injection What is this medicine? RITUXIMAB (ri TUX i mab) is a monoclonal antibody. It is used to treat certain types of cancer like non-Hodgkin lymphoma and chronic lymphocytic leukemia. It is also used to treat rheumatoid arthritis, granulomatosis with polyangiitis (or Wegener's granulomatosis), and microscopic polyangiitis. This medicine may be used for other purposes; ask your health care provider or pharmacist if you have questions. COMMON BRAND NAME(S): Rituxan What should I tell my health care provider before I take this medicine? They need to know if you have any of these conditions: -heart disease -infection (especially a virus infection such as hepatitis B, chickenpox, cold sores, or herpes) -immune system problems -irregular heartbeat -kidney disease -lung or breathing disease, like asthma -recently received or scheduled to receive a vaccine -an unusual or allergic reaction to rituximab, mouse proteins, other medicines, foods, dyes, or preservatives -pregnant or trying to get pregnant -breast-feeding How should I use this medicine? This medicine is for infusion into a vein. It is administered in a hospital or clinic by a specially trained health care professional. A special MedGuide will be given to you by the pharmacist with each prescription and refill. Be sure to read this information carefully each time. Talk to your pediatrician regarding the use of this medicine in children. This medicine is not approved for use in children. Overdosage: If you think you have taken too much of this medicine contact a poison control center or emergency room at once. NOTE: This medicine is only for you. Do not share this medicine with others. What if I miss a dose? It is important not to miss a dose. Call your doctor or health care professional if you are unable to keep an appointment. What may interact with this  medicine? -cisplatin -other medicines for arthritis like disease modifying antirheumatic drugs or tumor necrosis factor inhibitors -live virus vaccines This list may not describe all possible interactions. Give your health care provider a list of all the medicines, herbs, non-prescription drugs, or dietary supplements you use. Also tell them if you smoke, drink alcohol, or use illegal drugs. Some items may interact with your medicine. What should I watch for while using this medicine? Your condition will be monitored carefully while you are receiving this medicine. You may need blood work done while you are taking this medicine. This medicine can cause serious allergic reactions. To reduce your risk you may need to take medicine before treatment with this medicine. Take your medicine as directed. In some patients, this medicine may cause a serious brain infection that may cause death. If you have any problems seeing, thinking, speaking, walking, or standing, tell your doctor right away. If you cannot reach your doctor, urgently seek other source of medical care. Call your doctor or health care professional for advice if you get a fever, chills or sore throat, or other symptoms of a cold or  flu. Do not treat yourself. This drug decreases your body's ability to fight infections. Try to avoid being around people who are sick. Do not become pregnant while taking this medicine or for 12 months after stopping it. Women should inform their doctor if they wish to become pregnant or think they might be pregnant. There is a potential for serious side effects to an unborn child. Talk to your health care professional or pharmacist for more information. What side effects may I notice from receiving this medicine? Side effects that you should report to your doctor or health care professional as soon as possible: -breathing problems -chest pain -dizziness or feeling faint -fast, irregular heartbeat -low blood  counts - this medicine may decrease the number of white blood cells, red blood cells and platelets. You may be at increased risk for infections and bleeding. -mouth sores -redness, blistering, peeling or loosening of the skin, including inside the mouth (this can be added for any serious or exfoliative rash that could lead to hospitalization) -signs of infection - fever or chills, cough, sore throat, pain or difficulty passing urine -signs and symptoms of kidney injury like trouble passing urine or change in the amount of urine -signs and symptoms of liver injury like dark yellow or brown urine; general ill feeling or flu-like symptoms; light-colored stools; loss of appetite; nausea; right upper belly pain; unusually weak or tired; yellowing of the eyes or skin -stomach pain -vomiting Side effects that usually do not require medical attention (report to your doctor or health care professional if they continue or are bothersome): -headache -joint pain -muscle cramps or muscle pain This list may not describe all possible side effects. Call your doctor for medical advice about side effects. You may report side effects to FDA at 1-800-FDA-1088. Where should I keep my medicine? This drug is given in a hospital or clinic and will not be stored at home. NOTE: This sheet is a summary. It may not cover all possible information. If you have questions about this medicine, talk to your doctor, pharmacist, or health care provider.  2018 Elsevier/Gold Standard (2015-10-04 15:28:09)

## 2017-12-04 ENCOUNTER — Telehealth: Payer: Self-pay

## 2017-12-04 ENCOUNTER — Other Ambulatory Visit: Payer: Self-pay | Admitting: Radiology

## 2017-12-04 DIAGNOSIS — C8330 Diffuse large B-cell lymphoma, unspecified site: Secondary | ICD-10-CM | POA: Diagnosis not present

## 2017-12-04 NOTE — Telephone Encounter (Signed)
Voicemail received:  "Alex Mendoza [phone]) calling, not sure if I ned to go to ED but had first chemo today with [facility]. Noticing my pee output going down a lot today.  Call me at [phone].  Thanks."

## 2017-12-04 NOTE — Telephone Encounter (Signed)
Called pt regarding FMLA paperwork pt had intended to send to confirm that it did not get lost or sent to the incorrect fax. Pt verbalized that he had not yet sent the paperwork. Pt did voice concern that he attempted to call last night in response to decreased urine output despite steady intake. Pt left 2 VM, but did not receive a call back. Pt at [facility] today to see another physician. Encouraged to call back with any changes or lab results that Dr. Irene Limbo should be made aware of. To discuss concern regarding phone system with Alphonzo Grieve.

## 2017-12-05 ENCOUNTER — Telehealth: Payer: Self-pay

## 2017-12-05 ENCOUNTER — Other Ambulatory Visit: Payer: Self-pay | Admitting: Student

## 2017-12-05 ENCOUNTER — Encounter: Payer: Self-pay | Admitting: Hematology

## 2017-12-05 NOTE — Telephone Encounter (Signed)
Pt msg through MyChart requesting port flush to be added on 12/15/17. Scheduling msg sent to have appts changed to include port flush. Responded to pt via MyChart to contact office on Thursday (12/11/17) if he has yet to be contacted by the office.

## 2017-12-08 ENCOUNTER — Ambulatory Visit (HOSPITAL_COMMUNITY)
Admission: RE | Admit: 2017-12-08 | Discharge: 2017-12-08 | Disposition: A | Discharge status: Home / Self Care | Payer: 59 | Source: Ambulatory Visit | Attending: Hematology | Admitting: Hematology

## 2017-12-08 ENCOUNTER — Encounter (HOSPITAL_COMMUNITY): Payer: Self-pay

## 2017-12-08 DIAGNOSIS — E041 Nontoxic single thyroid nodule: Secondary | ICD-10-CM | POA: Diagnosis not present

## 2017-12-08 DIAGNOSIS — Z79899 Other long term (current) drug therapy: Secondary | ICD-10-CM | POA: Diagnosis not present

## 2017-12-08 DIAGNOSIS — I10 Essential (primary) hypertension: Secondary | ICD-10-CM | POA: Diagnosis not present

## 2017-12-08 DIAGNOSIS — E7800 Pure hypercholesterolemia, unspecified: Secondary | ICD-10-CM | POA: Insufficient documentation

## 2017-12-08 DIAGNOSIS — C73 Malignant neoplasm of thyroid gland: Secondary | ICD-10-CM | POA: Diagnosis not present

## 2017-12-08 DIAGNOSIS — C8338 Diffuse large B-cell lymphoma, lymph nodes of multiple sites: Secondary | ICD-10-CM | POA: Insufficient documentation

## 2017-12-08 DIAGNOSIS — E042 Nontoxic multinodular goiter: Secondary | ICD-10-CM | POA: Diagnosis not present

## 2017-12-08 DIAGNOSIS — Z882 Allergy status to sulfonamides status: Secondary | ICD-10-CM | POA: Diagnosis not present

## 2017-12-08 LAB — CBC
HCT: 43.9 % (ref 39.0–52.0)
HEMOGLOBIN: 14.4 g/dL (ref 13.0–17.0)
MCH: 29.1 pg (ref 26.0–34.0)
MCHC: 32.8 g/dL (ref 30.0–36.0)
MCV: 88.9 fL (ref 78.0–100.0)
Platelets: 202 10*3/uL (ref 150–400)
RBC: 4.94 MIL/uL (ref 4.22–5.81)
RDW: 12.9 % (ref 11.5–15.5)
WBC: 7.9 10*3/uL (ref 4.0–10.5)

## 2017-12-08 LAB — PROTIME-INR
INR: 1.01
PROTHROMBIN TIME: 13.2 s (ref 11.4–15.2)

## 2017-12-08 MED ORDER — MIDAZOLAM HCL 2 MG/2ML IJ SOLN
INTRAMUSCULAR | Status: AC
  Filled 2017-12-08: qty 2

## 2017-12-08 MED ORDER — SODIUM CHLORIDE 0.9 % IV SOLN: INTRAVENOUS | Status: DC

## 2017-12-08 MED ORDER — SODIUM CHLORIDE 0.9 % IV SOLN
INTRAVENOUS | Status: AC | PRN
  Administered 2017-12-08: 10 mL/h via INTRAVENOUS

## 2017-12-08 MED ORDER — SODIUM CHLORIDE 0.9% FLUSH: 10.0000 mL | INTRAVENOUS | Status: DC | PRN

## 2017-12-08 MED ORDER — FENTANYL CITRATE (PF) 100 MCG/2ML IJ SOLN
INTRAMUSCULAR | Status: AC
  Filled 2017-12-08: qty 2

## 2017-12-08 MED ORDER — HEPARIN SOD (PORK) LOCK FLUSH 100 UNIT/ML IV SOLN
500.0000 [IU] | INTRAVENOUS | Status: AC | PRN
  Administered 2017-12-08: 500 [IU]

## 2017-12-08 MED ORDER — LIDOCAINE HCL (PF) 1 % IJ SOLN
INTRAMUSCULAR | Status: AC
  Filled 2017-12-08: qty 30

## 2017-12-08 NOTE — Sedation Documentation (Signed)
Pt has elected to not have sedation. MD going ahead with procedure. Spouse is in wating room. Writer staying througout procedure to assist patient if necessary. VS stable, will continue to monitor.

## 2017-12-08 NOTE — Procedures (Signed)
Interventional Radiology Procedure Note  Procedure:  US guided FNA biopsy of bilateral thyroid nodules, with AFIRMA test for both. US guided FNA of small left cervical lymph node.   Complications: None  Recommendations:  - DC now - Pathology follow up - Routine wound care   Signed,  Dulcy Fanny. Earleen Newport, DO

## 2017-12-08 NOTE — Discharge Instructions (Addendum)

## 2017-12-08 NOTE — H&P (Signed)
Chief Complaint: Patient was seen in consultation today for Bilateral thyroid nodule biopsy and left cervical lymph node biopsy at the request of Brunetta Genera  Referring Physician(s): Brunetta Genera  Supervising Physician: Corrie Mckusick  Patient Status: [facility] - Out-pt  History of Present Illness: Alex Mendoza is a 47 y.o. male   Newly diagnosed diffuse large B cell lymphoma  10/31/17 biopsy: 1. Breast, left, needle core biopsy, UOQ, 12:30 o'clock - DIFFUSE LARGE B-CELL LYMPHOMA - SEE COMMENT 2. Lymph node, needle/core biopsy, left axilla - DIFFUSE LARGE B-CELL LYMPHOMA  PET with findings of + Thyroid nodules and left cervical lymphadenopathy 11/25/17:  IMPRESSION: 1. Hypermetabolic left axillary adenopathy and outer left breast mass, compatible with biopsy-proven lymphoma. 2. Hypermetabolic bilateral thyroid lobe nodules and hypermetabolic left levels 4 and 7 neck lymph nodes. These findings could all be due to involvement by lymphoma, although the possibility of a second primary malignancy of the thyroid cannot be excluded. 3. No additional hypermetabolic sites of disease. Normal size and activity of the spleen. 4. Right adrenal adenoma.  Now scheduled for thyroid nodules biopsy and left cervical LN bx   Past Medical History:  Diagnosis Date  . Bradycardia 07/20/2015  . Hypercholesteremia   . Hypertension   . Lymphoma, diffuse ([place])   . Palpitations 07/20/2015    Past Surgical History:  Procedure Laterality Date  . DG GALL BLADDER    . IR IMAGING GUIDED PORT INSERTION  11/28/2017  . KNEE SURGERY Right   . SHOULDER SURGERY Left     Allergies: Sulfa antibiotics  Medications: Prior to Admission medications   Medication Sig Start Date End Date Taking? Authorizing Provider  predniSONE (DELTASONE) 20 MG tablet Take 3 tablets (60 mg total) by mouth daily. Take on days 1-5 of chemotherapy. 11/27/17  Yes Brunetta Genera, MD    lidocaine-prilocaine (EMLA) cream Apply to affected area once 11/27/17   Brunetta Genera, MD  LORazepam (ATIVAN) 0.5 MG tablet Take 1 tablet (0.5 mg total) by mouth every 6 (six) hours as needed (Nausea or vomiting). 11/27/17   Brunetta Genera, MD  ondansetron (ZOFRAN) 8 MG tablet Take 1 tablet (8 mg total) by mouth 2 (two) times daily as needed for refractory nausea / vomiting. Start on day 3 after chemotherapy. 11/27/17   Brunetta Genera, MD  prochlorperazine (COMPAZINE) 10 MG tablet Take 1 tablet (10 mg total) by mouth every 6 (six) hours as needed (Nausea or vomiting). 11/27/17   Brunetta Genera, MD  rosuvastatin (CRESTOR) 10 MG tablet Take 10 mg by mouth daily.    [provider]     Family History  Problem Relation Age of Onset  . Atrial fibrillation Mother   . Heart failure Maternal Grandmother   . Heart failure Paternal Grandmother   . Bradycardia Father   . Cancer Paternal Aunt     Social History   Socioeconomic History  . Marital status: Married    Spouse name: Margaretha Sheffield  . Number of children: 2  . Years of education: 55  . Highest education level: Not on file  Occupational History    Comment: [employer]  Social Needs  . Financial resource strain: Not on file  . Food insecurity:    Worry: Not on file    Inability: Not on file  . Transportation needs:    Medical: Not on file    Non-medical: Not on file  Tobacco Use  . Smoking status: Never Smoker  . Smokeless tobacco: Never Used  Substance and Sexual Activity  . Alcohol use: No    Alcohol/week: 0.0 standard drinks  . Drug use: No  . Sexual activity: Not on file  Lifestyle  . Physical activity:    Days per week: Not on file    Minutes per session: Not on file  . Stress: Not on file  Relationships  . Social connections:    Talks on phone: Not on file    Gets together: Not on file    Attends religious service: Not on file    Active member of club or organization: Not on file    Attends  meetings of clubs or organizations: Not on file    Relationship status: Not on file  Other Topics Concern  . Not on file  Social History Narrative   Lives with wife, family   Caffeine- some chocolate     Review of Systems: A 12 point ROS discussed and pertinent positives are indicated in the HPI above.  All other systems are negative.  Review of Systems  Constitutional: Negative for activity change, fatigue and fever.  Respiratory: Negative for cough and shortness of breath.   Cardiovascular: Negative for chest pain.  Gastrointestinal: Negative for abdominal pain.  Musculoskeletal: Negative for back pain.  Neurological: Negative for weakness.  Psychiatric/Behavioral: Negative for behavioral problems and confusion.    Vital Signs: BP (!) 125/91   Temp 98.1 F (36.7 C) (Oral)   Resp 16   Ht 5\' 8"  (1.727 m)   Wt 193 lb (87.5 kg)   SpO2 96%   BMI 29.35 kg/m   Physical Exam  Constitutional: He is oriented to person, place, and time.  Cardiovascular: Normal rate, regular rhythm and normal heart sounds.  Pulmonary/Chest: Effort normal and breath sounds normal.  Abdominal: Soft. Bowel sounds are normal.  Musculoskeletal: Normal range of motion.  Neurological: He is alert and oriented to person, place, and time.  Skin: Skin is warm and dry.  Psychiatric: He has a normal mood and affect. His behavior is normal. Judgment and thought content normal.  Vitals reviewed.   Imaging: Mr Jeri Cos FY Contrast  Result Date: 11/21/2017 CLINICAL DATA:  Diffuse large B cell lymphoma of the left breast. EXAM: MRI HEAD WITHOUT AND WITH CONTRAST TECHNIQUE: Multiplanar, multiecho pulse sequences of the brain and surrounding structures were obtained without and with intravenous contrast. CONTRAST:  7 cc Gadovist COMPARISON:  05/01/2016 FINDINGS: Brain: Brain has normal appearance without evidence of malformation, atrophy, old or acute small or large vessel infarction, mass lesion, hemorrhage,  hydrocephalus or extra-axial collection. After contrast administration, no abnormal enhancement occurs. Vascular: Major vessels at the base of the brain show flow. Venous sinuses appear patent. Skull and upper cervical spine: Normal. Sinuses/Orbits: Clear/normal. Other: None significant. IMPRESSION: Normal examination.  No evidence of malignant involvement. Electronically Signed   By: Nelson Chimes M.D.   On: 11/21/2017 20:05   Nm Pet Image Initial (pi) Skull Base To Thigh  Result Date: 11/26/2017 CLINICAL DATA:  Initial treatment strategy for diffuse large B-cell lymphoma. EXAM: NUCLEAR MEDICINE PET SKULL BASE TO THIGH TECHNIQUE: 10.4 mCi F-18 FDG was injected intravenously. Full-ring PET imaging was performed from the skull base to thigh after the radiotracer. CT data was obtained and used for attenuation correction and anatomic localization. Fasting blood glucose: 91 mg/dl COMPARISON:  None. FINDINGS: Mediastinal blood pool activity: SUV max 2.3 NECK: There are hypermetabolic bilateral thyroid lobe hypodense nodules, largest 1.5 cm in upper left thyroid lobe with max SUV  28.1 (series 4/image 40) and 1.3 cm with max SUV 18.4 in the lower right thyroid lobe (series 4/image 52). There is a hypermetabolic calcified 0.7 cm left level 7 (delphian) neck lymph node posterior and inferior to the left thyroid lobe with max SUV 5.3 (series 4/image 54). There are clustered hypermetabolic left level 4 neck lymph nodes, largest 1.2 cm with max SUV 6.5 (series 4/image 52). Incidental CT findings: none CHEST: Hypermetabolic 1.3 cm subcutaneous outer left breast soft tissue mass with max SUV 8.1 (series 4/image 67), containing internal biopsy clip. Moderate hypermetabolic left axillary adenopathy, with dominant 2.3 cm left axillary node with max SUV 21.9 (series 4/image 64). No hypermetabolic right axillary, mediastinal or hilar nodes. No hypermetabolic pulmonary findings. Incidental CT findings: No acute consolidative  airspace disease, lung masses or significant pulmonary nodules. ABDOMEN/PELVIS: No abnormal hypermetabolic activity within the liver, pancreas, adrenal glands, or spleen. No hypermetabolic lymph nodes in the abdomen or pelvis. Incidental CT findings: Cholecystectomy. Right adrenal 2.5 cm adenoma with density 3 HU. SKELETON: No focal hypermetabolic activity to suggest skeletal metastasis. Incidental CT findings: Soft tissue anchors are noted in the right glenoid. IMPRESSION: 1. Hypermetabolic left axillary adenopathy and outer left breast mass, compatible with biopsy-proven lymphoma. 2. Hypermetabolic bilateral thyroid lobe nodules and hypermetabolic left levels 4 and 7 neck lymph nodes. These findings could all be due to involvement by lymphoma, although the possibility of a second primary malignancy of the thyroid cannot be excluded. 3. No additional hypermetabolic sites of disease. Normal size and activity of the spleen. 4. Right adrenal adenoma. Electronically Signed   By: Ilona Sorrel M.D.   On: 11/26/2017 09:36   US Thyroid  Result Date: 11/29/2017 CLINICAL DATA:  Incidental on PET. Diffuse large B-cell lymphoma proven by biopsy of left axillary lymph node and left breast/chest wall mass. PET evaluation demonstrates bilateral hypermetabolic thyroid nodules. EXAM: THYROID ULTRASOUND TECHNIQUE: Ultrasound examination of the thyroid gland and adjacent soft tissues was performed. COMPARISON:  PET scan on 11/25/2017 FINDINGS: Parenchymal Echotexture: Mildly heterogenous Isthmus: 0.2 cm Right lobe: 5.0 x 2.8 x 2.2 cm Left lobe: 4.7 x 2.3 x 2.7 cm _________________________________________________________ Estimated total number of nodules >/= 1 cm: 6-10 Number of spongiform nodules >/=  2 cm not described below (TR1): 0 Number of mixed cystic and solid nodules >/= 1.5 cm not described below (TR2): 0 _________________________________________________________ Multiple nodules are present. The 4 largest and most  suspicious are characterized formally. Additional comment on other nodules given below. Nodule # 1: Location: Right; Inferior (Labeled right #3 on worksheet) Maximum size: 1.1 cm; Other 2 dimensions: 0.7 x 0.9 cm Composition: solid/almost completely solid (2) Echogenicity: hypoechoic (2) Shape: not taller-than-wide (0) Margins: ill-defined (0) Echogenic foci: punctate echogenic foci (3) ACR TI-RADS total points: 7. ACR TI-RADS risk category: TR5 (>/= 7 points). ACR TI-RADS recommendations: **Given size (>/= 1.0 cm) and appearance, fine needle aspiration of this highly suspicious nodule should be considered based on TI-RADS criteria. This nodule corresponds to the hypermetabolic PET positive nodule on the right. _________________________________________________________ Nodule # 2: Location: Left; Mid (Labeled left #1 on worksheet) Maximum size: 1.9 cm; Other 2 dimensions: 0.8 x 0.8 cm Composition: solid/almost completely solid (2) Echogenicity: hypoechoic (2) Shape: not taller-than-wide (0) Margins: ill-defined (0) Echogenic foci: peripheral calcifications (2) ACR TI-RADS total points: 6. ACR TI-RADS risk category: TR4 (4-6 points). ACR TI-RADS recommendations: **Given size (>/= 1.5 cm) and appearance, fine needle aspiration of this moderately suspicious nodule should be considered based on TI-RADS  criteria. This nodule corresponds to the hypermetabolic PET positive nodule on the left. _________________________________________________________ Nodule # 3: Location: Left; Inferior (Left #2 on worksheet) Maximum size: 1.5 cm; Other 2 dimensions: 1.0 x 1.1 cm Composition: solid/almost completely solid (2) Echogenicity: isoechoic (1) Shape: not taller-than-wide (0) Margins: smooth (0) Echogenic foci: none (0) ACR TI-RADS total points: 3. ACR TI-RADS risk category: TR3 (3 points). ACR TI-RADS recommendations: *Given size (>/= 1.5 - 2.4 cm) and appearance, a follow-up ultrasound in 1 year should be considered based on  TI-RADS criteria. _________________________________________________________ Nodule # 4: Location: Left; Inferior (Left #3 on worksheet) Maximum size: 1.5 cm; Other 2 dimensions: 1.3 x 1.3 cm Composition: solid/almost completely solid (2) Echogenicity: hypoechoic (2) Shape: not taller-than-wide (0) Margins: smooth (0) Echogenic foci: none (0) ACR TI-RADS total points: 4. ACR TI-RADS risk category: TR4 (4-6 points). ACR TI-RADS recommendations: **Given size (>/= 1.5 cm) and appearance, fine needle aspiration of this moderately suspicious nodule should be considered based on TI-RADS criteria. This nodule is borderline for fine-needle aspiration and was not PET positive. _________________________________________________________ There are 2 additional partially cystic nodules in the right lobe and a 1 cm solid nodule. These do not meet criteria for fine-needle aspiration currently. Additional area measured up to 1.7 cm in the right inferior lobe is very ill-defined and likely represents an area of heterogeneity rather than a true discrete nodule. Multiple scattered small lymph nodes are seen bilaterally in the neck. IMPRESSION: 1. 1.1 cm right inferior thyroid nodule meets criteria for fine-needle aspiration and corresponds to the hypermetabolic right-sided nodule by PET scan. This nodule contains punctate echogenic foci. 2. 1.9 cm left mid thyroid nodule meets criteria for fine-needle aspiration and corresponds to the hypermetabolic left-sided nodule by PET scan. This nodule demonstrates prominent peripheral calcification and some internal calcification. 3. Only recommend fine-needle aspiration currently of the 2 above nodules. A 1 year follow-up ultrasound is recommended for 2 additional 1.5 cm left inferior thyroid nodules above. The above is in keeping with the ACR TI-RADS recommendations - J Am Coll Radiol 2017;14:587-595. Electronically Signed   By: Aletta Edouard M.D.   On: 11/29/2017 09:21   Ir Imaging Guided  Port Insertion  Result Date: 11/28/2017 CLINICAL DATA:  Diffuse large B-cell lymphoma, needs durable venous access for treatment regimen EXAM: TUNNELED PORT CATHETER PLACEMENT WITH ULTRASOUND AND FLUOROSCOPIC GUIDANCE FLUOROSCOPY TIME:  0.1 minute; 22 uGym2 DAP ANESTHESIA/SEDATION: Intravenous Fentanyl and Versed were administered as conscious sedation during continuous monitoring of the patient's level of consciousness and physiological / cardiorespiratory status by the radiology RN, with a total moderate sedation time of 15 minutes. TECHNIQUE: The procedure, risks, benefits, and alternatives were explained to the patient. Questions regarding the procedure were encouraged and answered. The patient understands and consents to the procedure. As antibiotic prophylaxis, cefazolin 2 g was ordered pre-procedure and administered intravenously within one hour of incision. Patency of the right IJ vein was confirmed with ultrasound with image documentation. An appropriate skin site was determined. Skin site was marked. Region was prepped using maximum barrier technique including cap and mask, sterile gown, sterile gloves, large sterile sheet, and Chlorhexidine as cutaneous antisepsis. The region was infiltrated locally with 1% lidocaine. Under real-time ultrasound guidance, the right IJ vein was accessed with a 21 gauge micropuncture needle; the needle tip within the vein was confirmed with ultrasound image documentation. Needle was exchanged over a 018 guidewire for transitional dilator which allowed passage of the [facility] wire into the IVC. Over this, the transitional dilator  was exchanged for a 5 [place] MPA catheter. A small incision was made on the right anterior chest wall and a subcutaneous pocket fashioned. The power-injectable port was positioned and its catheter tunneled to the right IJ dermatotomy site. The MPA catheter was exchanged over an Amplatz wire for a peel-away sheath, through which the port catheter,  which had been trimmed to the appropriate length, was advanced and positioned under fluoroscopy with its tip at the cavoatrial junction. Spot chest radiograph confirms good catheter position and no pneumothorax. The pocket was closed with deep interrupted and subcuticular continuous 3-0 Monocryl sutures. The port was flushed per protocol. The incisions were covered with Dermabond then covered with a sterile dressing. COMPLICATIONS: COMPLICATIONS None immediate IMPRESSION: Technically successful right IJ power-injectable port catheter placement. Ready for routine use. Electronically Signed   By: Lucrezia Europe M.D.   On: 11/28/2017 12:45    Labs:  CBC: Recent Labs    11/27/17 1343 11/28/17 1056 12/03/17 0819 12/08/17 0630  WBC 10.5* 6.8 7.6 7.9  HGB 15.0 16.1 14.7 14.4  HCT 45.0 47.1 44.0 43.9  PLT 216 223 184 202    COAGS: Recent Labs    11/28/17 1056  INR 0.96  APTT 30    BMP: Recent Labs    11/14/17 1448 11/27/17 1343 12/03/17 0819  NA 142 142 140  K 4.2 4.4 4.1  CL 108 106 106  CO2 24 27 25   GLUCOSE 94 101* 126*  BUN 19 20 21*  CALCIUM 9.4 9.4 9.0  CREATININE 1.11 1.31* 0.92  GFRNONAA >60 >60 >60  GFRAA >60 >60 >60    LIVER FUNCTION TESTS: Recent Labs    11/14/17 1448 11/27/17 1343 12/03/17 0819  BILITOT 0.7 0.7 0.8  AST 51* 19 21  ALT 50* 21 25  ALKPHOS 57 65 62  PROT 7.1 7.2 7.0  ALBUMIN 4.1 4.2 3.9    TUMOR MARKERS: No results for input(s): AFPTM, CEA, CA199, CHROMGRNA in the last 8760 hours.  Assessment and Plan:  Large B Cell Lymphoma per Left breast and Left axillary LN bx 10/31/17 PET + for left cervical node and B Thyroid  Now scheduled for additional biopsy---question lymphoma vs second primary Risks and benefits discussed with the patient including, but not limited to bleeding, infection, damage to adjacent structures or low yield requiring additional tests.  All of the patient's questions were answered, patient is agreeable to  proceed. Consent signed and in chart.   Thank you for this interesting consult.  I greatly enjoyed meeting [employer] [facility] and look forward to participating in their care.  A copy of this report was sent to the requesting provider on this date.  Electronically Signed: Lavonia Drafts, PA-C 12/08/2017, 7:13 AM   I spent a total of    25 Minutes in face to face in clinical consultation, greater than 50% of which was counseling/coordinating care for B thyroid nodule biopsies and left cervical LN bx

## 2017-12-09 ENCOUNTER — Encounter: Payer: Self-pay | Admitting: Hematology

## 2017-12-09 DIAGNOSIS — Z7189 Other specified counseling: Secondary | ICD-10-CM | POA: Insufficient documentation

## 2017-12-12 DIAGNOSIS — C8339 Diffuse large B-cell lymphoma, extranodal and solid organ sites: Secondary | ICD-10-CM | POA: Diagnosis not present

## 2017-12-12 DIAGNOSIS — C8334 Diffuse large B-cell lymphoma, lymph nodes of axilla and upper limb: Secondary | ICD-10-CM | POA: Diagnosis not present

## 2017-12-12 NOTE — Progress Notes (Signed)
HEMATOLOGY/ONCOLOGY CONSULTATION NOTE  Date of Service: 12/12/2017  Patient Care Team: Orpah Melter, MD as PCP - General (Family Medicine)  CHIEF COMPLAINTS/PURPOSE OF CONSULTATION:  Newly diagnosed Diffuse Large B-Cell Lymphoma   HISTORY OF PRESENTING ILLNESS:   Alex Mendoza is a wonderful 47 y.o. male who has been referred to [place] by Alesia Morin, NP for evaluation and management of Diffuse Large B-Cell Lymphoma. He is accompanied today by his wife. The pt reports that he is doing well overall.   The pt presented to care with his PCP Alesia Morin on 10/29/17 complaining of a swollen left armpit and a lump on left pectoral area that had not changed in 12 months. He first noted the left armpit 3 weeks ago, which he notes is growing quickly.   The pt reports that he occasionally gets headaches that present with some numbness and other feelings that he notes are difficult to describe. He notes that these headaches have worsened over the last 6 months and have caused him to miss work. He notes that these headaches seem to concentrate in the center of his forehead and he has needed to use up to 4-6 aspirin. He notes that his headaches have been evaluated in January 2018 with a MRI Brain which was normal.   The pt notes that he did have a deer tick bite in July 2017 and received prophylactic antibiotics.   The pt notes that he stays very active and eats healthy in general. He notes that he has anxiety and tries to mitigate his anxiety with cardio and staying active. He also recently began Buspirone and notes that he has been sleeping better in the last week.   Of note prior to the patient's visit today, pt has had Left breast nodule and Left axillary biopsies completed on 10/31/17 with results revealing Diffuse Large B-Cell Lymphoma.   Most recent lab results (10/29/17) of CBC is as follows: all values are WNL except for ANC at 8.5k.   On review of systems, pt reports staying active,  worsened headaches over 6 months, good energy levels, some anxiety, and denies skin rashes, drenching night sweats, fevers, chills, unexpected weight loss, abdominal pains, SOB, chest discomfort, leg swelling, joint pain and swelling, and any other symptoms.   On PMHx the pt reports borderline hypertension, hyperlipidemia, occasional somnambulism, choleycystectomy in 2007, left knee surgery. He denies sleep apnea.  On Social Hx the pt denies any ETOH consumption and denies ever smoking.  On Family Hx the pt reports paternal aunt with brain tumor, and denies blood or cancer disorders or CAD.  He reports sulfa antibiotic allergies with whole body rash.  Interval History:   Alex Mendoza returns today for management, evaluation, and C1D13 R-CHOP treatment of his Diffuse Large B-Cell Lymphoma. The patient's last visit with [place] was on 11/27/17. He is accompanied today by his wife. The pt reports that he is doing well overall.   The pt reports that after his first cycle, he noted that he had thickened urine with "floaties" in his urine that resolved on its own. He notes that following his first cycle, he noted that the tumor under his left axilla shrunk significantly. He reports associated dull constant pain to his left side that is not worsened with movement or position change and an oral sore. He denies hair loss (he obtains hair cuts every month), and any other symptoms. He has been evaluated by Dr. Lanier Ensign at [facility] on 12/09/2017 for a second opinion.  Of note since the patient's last visit, pt has had US guided biopsies of the thyroid completed on 12/08/17 with results revealing metastatic papillary thyroid carcinoma.  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   Lab results today (12/15/17) of CBC  w/diff, CMP, and Reticulocytes is as follows: all values are WNL except for CBC with WBC at 2.7. CMP, LDH, Uric Acid PENDING.    MEDICAL HISTORY:  Past Medical History:  Diagnosis Date  . Bradycardia 07/20/2015  . Hypercholesteremia   . Hypertension   . Lymphoma, diffuse ([place])   . Palpitations 07/20/2015    SURGICAL HISTORY: Past Surgical History:  Procedure Laterality Date  . DG GALL BLADDER    . IR IMAGING GUIDED PORT INSERTION  11/28/2017  . KNEE SURGERY Right   . SHOULDER SURGERY Left     SOCIAL HISTORY: Social History   Socioeconomic History  . Marital status: Married    Spouse name: Margaretha Sheffield  . Number of children: 2  . Years of education: 58  . Highest education level: Not on file  Occupational History    Comment: [employer]  Social Needs  . Financial resource strain: Not on file  . Food insecurity:    Worry: Not on file    Inability: Not on file  . Transportation needs:    Medical: Not on file    Non-medical: Not on file  Tobacco Use  . Smoking status: Never Smoker  . Smokeless tobacco: Never Used  Substance and Sexual Activity  . Alcohol use: No    Alcohol/week: 0.0 standard drinks  . Drug use: No  . Sexual activity: Not on file  Lifestyle  . Physical activity:    Days per week: Not on file    Minutes per session: Not on file  . Stress: Not on file  Relationships  . Social connections:    Talks on phone: Not on file    Gets together: Not on file    Attends religious service: Not on file    Active member of club or organization: Not on file    Attends meetings of clubs or organizations: Not on file    Relationship status: Not on file  . Intimate partner violence:    Fear of current or ex partner: Not on file    Emotionally abused: Not on file    Physically abused: Not on file    Forced sexual activity: Not on file  Other Topics Concern  . Not on file  Social History Narrative   Lives with wife, family   Caffeine- some chocolate    FAMILY  HISTORY: Family History  Problem Relation Age of Onset  . Atrial fibrillation Mother   . Heart failure Maternal Grandmother   . Heart failure Paternal Grandmother   . Bradycardia Father   . Cancer Paternal Aunt     ALLERGIES:  is allergic to sulfa antibiotics.  MEDICATIONS:  Current Outpatient Medications  Medication Sig Dispense Refill  . lidocaine-prilocaine (EMLA) cream Apply to affected area once 30 g 3  . LORazepam (ATIVAN) 0.5 MG tablet Take 1 tablet (0.5 mg total) by mouth every 6 (six) hours as needed (Nausea or vomiting). 30 tablet 0  . ondansetron (ZOFRAN) 8 MG tablet Take 1 tablet (8 mg total) by mouth 2 (two) times  daily as needed for refractory nausea / vomiting. Start on day 3 after chemotherapy. 30 tablet 1  . predniSONE (DELTASONE) 20 MG tablet Take 3 tablets (60 mg total) by mouth daily. Take on days 1-5 of chemotherapy. 15 tablet 6  . prochlorperazine (COMPAZINE) 10 MG tablet Take 1 tablet (10 mg total) by mouth every 6 (six) hours as needed (Nausea or vomiting). 30 tablet 6  . rosuvastatin (CRESTOR) 10 MG tablet Take 10 mg by mouth daily.     No current facility-administered medications for this visit.    Facility-Administered Medications Ordered in Other Visits  Medication Dose Route Frequency Provider Last Rate Last Dose  . gadopentetate dimeglumine (MAGNEVIST) injection 18 mL  18 mL Intravenous Once PRN Penumalli, Earlean Polka, MD        REVIEW OF SYSTEMS:    A 10+ POINT REVIEW OF SYSTEMS WAS OBTAINED including neurology, dermatology, psychiatry, cardiac, respiratory, lymph, extremities, GI, GU, Musculoskeletal, constitutional, breasts, reproductive, HEENT.  All pertinent positives are noted in the HPI.  All others are negative.   PHYSICAL EXAMINATION: ECOG PERFORMANCE STATUS: 1 - Symptomatic but completely ambulatory  . Vitals:   12/15/17 1202  BP: (!) 136/94  Pulse: 63  Resp: 18  Temp: 98.3 F (36.8 C)  SpO2: 100%   Filed Weights   12/15/17 1202   Weight: 190 lb 8 oz (86.4 kg)   .Body mass index is 28.97 kg/m.  GENERAL:alert, in no acute distress and comfortable SKIN: no acute rashes, no significant lesions EYES: conjunctiva are pink and non-injected, sclera anicteric OROPHARYNX: MMM, no exudates, no oropharyngeal erythema or ulceration NECK: supple, no JVD LYMPH:  4-5cm LN in left axilla, 2-3cm LN in chest wall, no palpable lymphadenopathy in the cervical or inguinal regions LUNGS: clear to auscultation b/l with normal respiratory effort HEART: regular rate & rhythm ABDOMEN:  normoactive bowel sounds , non tender, not distended. No palpable hepatosplenomegaly.  Extremity: no pedal edema PSYCH: alert & oriented x 3 with fluent speech NEURO: no focal motor/sensory deficits   LABORATORY DATA:  I have reviewed the data as listed  . CBC Latest Ref Rng & Units 12/08/2017 12/03/2017 11/28/2017  WBC 4.0 - 10.5 K/uL 7.9 7.6 6.8  Hemoglobin 13.0 - 17.0 g/dL 14.4 14.7 16.1  Hematocrit 39.0 - 52.0 % 43.9 44.0 47.1  Platelets 150 - 400 K/uL 202 184 223   Component     Latest Ref Rng & Units 11/14/2017  Sodium     135 - 145 mmol/L 142  Potassium     3.5 - 5.1 mmol/L 4.2  Chloride     98 - 111 mmol/L 108  CO2     22 - 32 mmol/L 24  Glucose     70 - 99 mg/dL 94  BUN     6 - 20 mg/dL 19  Creatinine     0.61 - 1.24 mg/dL 1.11  Calcium     8.9 - 10.3 mg/dL 9.4  Total Protein     6.5 - 8.1 g/dL 7.1  Albumin     3.5 - 5.0 g/dL 4.1  AST     15 - 41 U/L 51 (H)  ALT     0 - 44 U/L 50 (H)  Alkaline Phosphatase     38 - 126 U/L 57  Total Bilirubin     0.3 - 1.2 mg/dL 0.7  GFR, Est Non African American     >60 mL/min >60  GFR, Est African American     >  60 mL/min >60  Anion gap     5 - 15 10  LDH     98 - 192 U/L 219 (H)  HCV Ab     0.0 - 0.9 s/co ratio 0.1  Hep B Core Ab, Tot     Negative Negative  Hepatitis B Surface Ag     Negative Negative  HIV Screen 4th Generation wRfx     Non Reactive Non Reactive   12/08/17  Biopsy:   10/29/17 CBC w/diff and CMP:    10/31/17 Left Breast and Left Axilla Biopsies:   11/17/2017 Genetic Testing:    RADIOGRAPHIC STUDIES: I have personally reviewed the radiological images as listed and agreed with the findings in the report. Mr Jeri Cos Wo Contrast  Result Date: 11/21/2017 CLINICAL DATA:  Diffuse large B cell lymphoma of the left breast. EXAM: MRI HEAD WITHOUT AND WITH CONTRAST TECHNIQUE: Multiplanar, multiecho pulse sequences of the brain and surrounding structures were obtained without and with intravenous contrast. CONTRAST:  7 cc Gadovist COMPARISON:  05/01/2016 FINDINGS: Brain: Brain has normal appearance without evidence of malformation, atrophy, old or acute small or large vessel infarction, mass lesion, hemorrhage, hydrocephalus or extra-axial collection. After contrast administration, no abnormal enhancement occurs. Vascular: Major vessels at the base of the brain show flow. Venous sinuses appear patent. Skull and upper cervical spine: Normal. Sinuses/Orbits: Clear/normal. Other: None significant. IMPRESSION: Normal examination.  No evidence of malignant involvement. Electronically Signed   By: Nelson Chimes M.D.   On: 11/21/2017 20:05   Nm Pet Image Initial (pi) Skull Base To Thigh  Result Date: 11/26/2017 CLINICAL DATA:  Initial treatment strategy for diffuse large B-cell lymphoma. EXAM: NUCLEAR MEDICINE PET SKULL BASE TO THIGH TECHNIQUE: 10.4 mCi F-18 FDG was injected intravenously. Full-ring PET imaging was performed from the skull base to thigh after the radiotracer. CT data was obtained and used for attenuation correction and anatomic localization. Fasting blood glucose: 91 mg/dl COMPARISON:  None. FINDINGS: Mediastinal blood pool activity: SUV max 2.3 NECK: There are hypermetabolic bilateral thyroid lobe hypodense nodules, largest 1.5 cm in upper left thyroid lobe with max SUV 28.1 (series 4/image 40) and 1.3 cm with max SUV 18.4 in the lower right thyroid lobe  (series 4/image 52). There is a hypermetabolic calcified 0.7 cm left level 7 (delphian) neck lymph node posterior and inferior to the left thyroid lobe with max SUV 5.3 (series 4/image 54). There are clustered hypermetabolic left level 4 neck lymph nodes, largest 1.2 cm with max SUV 6.5 (series 4/image 52). Incidental CT findings: none CHEST: Hypermetabolic 1.3 cm subcutaneous outer left breast soft tissue mass with max SUV 8.1 (series 4/image 67), containing internal biopsy clip. Moderate hypermetabolic left axillary adenopathy, with dominant 2.3 cm left axillary node with max SUV 21.9 (series 4/image 64). No hypermetabolic right axillary, mediastinal or hilar nodes. No hypermetabolic pulmonary findings. Incidental CT findings: No acute consolidative airspace disease, lung masses or significant pulmonary nodules. ABDOMEN/PELVIS: No abnormal hypermetabolic activity within the liver, pancreas, adrenal glands, or spleen. No hypermetabolic lymph nodes in the abdomen or pelvis. Incidental CT findings: Cholecystectomy. Right adrenal 2.5 cm adenoma with density 3 HU. SKELETON: No focal hypermetabolic activity to suggest skeletal metastasis. Incidental CT findings: Soft tissue anchors are noted in the right glenoid. IMPRESSION: 1. Hypermetabolic left axillary adenopathy and outer left breast mass, compatible with biopsy-proven lymphoma. 2. Hypermetabolic bilateral thyroid lobe nodules and hypermetabolic left levels 4 and 7 neck lymph nodes. These findings could all be due to  involvement by lymphoma, although the possibility of a second primary malignancy of the thyroid cannot be excluded. 3. No additional hypermetabolic sites of disease. Normal size and activity of the spleen. 4. Right adrenal adenoma. Electronically Signed   By: Ilona Sorrel M.D.   On: 11/26/2017 09:36   [place] Fna Bx Thyroid 1st Lesion Afirma  Result Date: 12/08/2017 INDICATION: 47 year old male with a history lymphoma and multiple bilateral thyroid  nodules FDG avid on recent PET-CT. Additionally cervical lymph nodes are FDG avid. He has been referred for bilateral FNA of thyroid nodules and cervical lymph node. EXAM: ULTRASOUND-GUIDED BIOPSY LEFT THYROID NODULE ULTRASOUND GUIDED BIOPSY LEFT CERVICAL LYMPH NODE ULTRASOUND-GUIDED BIOPSY RIGHT THYROID NODULE MEDICATIONS: None. ANESTHESIA/SEDATION: NONE FLUOROSCOPY TIME:  NONE COMPLICATIONS: NONE PROCEDURE: Informed written consent was obtained from the patient after a thorough discussion of the procedural risks, benefits and alternatives. All questions were addressed. Maximal Sterile Barrier Technique was utilized including caps, mask, sterile gowns, sterile gloves, sterile drape, hand hygiene and skin antiseptic. A timeout was performed prior to the initiation of the procedure. Patient positioned supine position on the ultrasound table. Images of the bilateral neck were performed with images stored sent to PACs. The neck was prepped and draped with the usual sterile fashion using chlorhexidine. 1% lidocaine was used for local anesthesia. On the left, 1% lidocaine was used for local anesthesia, and multiple FNA using 25 gauge needle were performed of the targeted left thyroid nodule. Images were stored. Samples were handed to the cytotechnologist for slide preparation and also 4 Afirma testing. We then used ultrasound for a targeted FNA of left-sided cervical lymph nodes. Slide preparation was performed. We then moved to the right neck with 1% lidocaine used for local anesthesia. Using ultrasound guidance, multiple 25 gauge needle were performed of the targeted right thyroid nodule. Images were stored. Samples were handed to the cytotechnologist for slide preparation in for Afirma testing. Final images were stored. Sterile bandage was were placed. Patient tolerated the procedure well and remained hemodynamically stable throughout. No complications were encountered and no significant blood loss. IMPRESSION: Status  post ultrasound-guided fine-needle biopsy of bilateral thyroid nodules, targeting the nodules identified on prior ultrasound survey, presumably FDG G avid on recent PET-CT. Status post ultrasound-guided fine-needle biopsy of left cervical lymph node, presumably the lymph nodes which are FDG avid on PET-CT. Signed, Dulcy Fanny. Dellia Nims, RPVI [facility] Radiology Electronically Signed   By: Corrie Mckusick D.O.   On: 12/08/2017 10:32   [place] Core Biopsy (lymph Nodes)  Result Date: 12/08/2017 INDICATION: 47 year old male with a history lymphoma and multiple bilateral thyroid nodules FDG avid on recent PET-CT. Additionally cervical lymph nodes are FDG avid. He has been referred for bilateral FNA of thyroid nodules and cervical lymph node. EXAM: ULTRASOUND-GUIDED BIOPSY LEFT THYROID NODULE ULTRASOUND GUIDED BIOPSY LEFT CERVICAL LYMPH NODE ULTRASOUND-GUIDED BIOPSY RIGHT THYROID NODULE MEDICATIONS: None. ANESTHESIA/SEDATION: NONE FLUOROSCOPY TIME:  NONE COMPLICATIONS: NONE PROCEDURE: Informed written consent was obtained from the patient after a thorough discussion of the procedural risks, benefits and alternatives. All questions were addressed. Maximal Sterile Barrier Technique was utilized including caps, mask, sterile gowns, sterile gloves, sterile drape, hand hygiene and skin antiseptic. A timeout was performed prior to the initiation of the procedure. Patient positioned supine position on the ultrasound table. Images of the bilateral neck were performed with images stored sent to PACs. The neck was prepped and draped with the usual sterile fashion using chlorhexidine. 1% lidocaine was used  for local anesthesia. On the left, 1% lidocaine was used for local anesthesia, and multiple FNA using 25 gauge needle were performed of the targeted left thyroid nodule. Images were stored. Samples were handed to the cytotechnologist for slide preparation and also 4 Afirma testing. We  then used ultrasound for a targeted FNA of left-sided cervical lymph nodes. Slide preparation was performed. We then moved to the right neck with 1% lidocaine used for local anesthesia. Using ultrasound guidance, multiple 25 gauge needle were performed of the targeted right thyroid nodule. Images were stored. Samples were handed to the cytotechnologist for slide preparation in for Afirma testing. Final images were stored. Sterile bandage was were placed. Patient tolerated the procedure well and remained hemodynamically stable throughout. No complications were encountered and no significant blood loss. IMPRESSION: Status post ultrasound-guided fine-needle biopsy of bilateral thyroid nodules, targeting the nodules identified on prior ultrasound survey, presumably FDG G avid on recent PET-CT. Status post ultrasound-guided fine-needle biopsy of left cervical lymph node, presumably the lymph nodes which are FDG avid on PET-CT. Signed, Dulcy Fanny. Dellia Nims, RPVI [facility] Radiology Electronically Signed   By: Corrie Mckusick D.O.   On: 12/08/2017 10:32   US Thyroid  Result Date: 11/29/2017 CLINICAL DATA:  Incidental on PET. Diffuse large B-cell lymphoma proven by biopsy of left axillary lymph node and left breast/chest wall mass. PET evaluation demonstrates bilateral hypermetabolic thyroid nodules. EXAM: THYROID ULTRASOUND TECHNIQUE: Ultrasound examination of the thyroid gland and adjacent soft tissues was performed. COMPARISON:  PET scan on 11/25/2017 FINDINGS: Parenchymal Echotexture: Mildly heterogenous Isthmus: 0.2 cm Right lobe: 5.0 x 2.8 x 2.2 cm Left lobe: 4.7 x 2.3 x 2.7 cm _________________________________________________________ Estimated total number of nodules >/= 1 cm: 6-10 Number of spongiform nodules >/=  2 cm not described below (TR1): 0 Number of mixed cystic and solid nodules >/= 1.5 cm not described below (TR2): 0  _________________________________________________________ Multiple nodules are present. The 4 largest and most suspicious are characterized formally. Additional comment on other nodules given below. Nodule # 1: Location: Right; Inferior (Labeled right #3 on worksheet) Maximum size: 1.1 cm; Other 2 dimensions: 0.7 x 0.9 cm Composition: solid/almost completely solid (2) Echogenicity: hypoechoic (2) Shape: not taller-than-wide (0) Margins: ill-defined (0) Echogenic foci: punctate echogenic foci (3) ACR TI-RADS total points: 7. ACR TI-RADS risk category: TR5 (>/= 7 points). ACR TI-RADS recommendations: **Given size (>/= 1.0 cm) and appearance, fine needle aspiration of this highly suspicious nodule should be considered based on TI-RADS criteria. This nodule corresponds to the hypermetabolic PET positive nodule on the right. _________________________________________________________ Nodule # 2: Location: Left; Mid (Labeled left #1 on worksheet) Maximum size: 1.9 cm; Other 2 dimensions: 0.8 x 0.8 cm Composition: solid/almost completely solid (2) Echogenicity: hypoechoic (2) Shape: not taller-than-wide (0) Margins: ill-defined (0) Echogenic foci: peripheral calcifications (2) ACR TI-RADS total points: 6. ACR TI-RADS risk category: TR4 (4-6 points). ACR TI-RADS recommendations: **Given size (>/= 1.5 cm) and appearance, fine needle aspiration of this moderately suspicious nodule should be considered based on TI-RADS criteria. This nodule corresponds to the hypermetabolic PET positive nodule on the left. _________________________________________________________ Nodule # 3: Location: Left; Inferior (Left #2 on worksheet) Maximum size: 1.5 cm; Other 2 dimensions: 1.0 x 1.1 cm Composition: solid/almost completely solid (2) Echogenicity: isoechoic (1) Shape: not taller-than-wide (0) Margins: smooth (0) Echogenic foci: none (0) ACR TI-RADS total points: 3. ACR TI-RADS risk category: TR3 (3 points). ACR TI-RADS recommendations:  *Given size (>/= 1.5 - 2.4  cm) and appearance, a follow-up ultrasound in 1 year should be considered based on TI-RADS criteria. _________________________________________________________ Nodule # 4: Location: Left; Inferior (Left #3 on worksheet) Maximum size: 1.5 cm; Other 2 dimensions: 1.3 x 1.3 cm Composition: solid/almost completely solid (2) Echogenicity: hypoechoic (2) Shape: not taller-than-wide (0) Margins: smooth (0) Echogenic foci: none (0) ACR TI-RADS total points: 4. ACR TI-RADS risk category: TR4 (4-6 points). ACR TI-RADS recommendations: **Given size (>/= 1.5 cm) and appearance, fine needle aspiration of this moderately suspicious nodule should be considered based on TI-RADS criteria. This nodule is borderline for fine-needle aspiration and was not PET positive. _________________________________________________________ There are 2 additional partially cystic nodules in the right lobe and a 1 cm solid nodule. These do not meet criteria for fine-needle aspiration currently. Additional area measured up to 1.7 cm in the right inferior lobe is very ill-defined and likely represents an area of heterogeneity rather than a true discrete nodule. Multiple scattered small lymph nodes are seen bilaterally in the neck. IMPRESSION: 1. 1.1 cm right inferior thyroid nodule meets criteria for fine-needle aspiration and corresponds to the hypermetabolic right-sided nodule by PET scan. This nodule contains punctate echogenic foci. 2. 1.9 cm left mid thyroid nodule meets criteria for fine-needle aspiration and corresponds to the hypermetabolic left-sided nodule by PET scan. This nodule demonstrates prominent peripheral calcification and some internal calcification. 3. Only recommend fine-needle aspiration currently of the 2 above nodules. A 1 year follow-up ultrasound is recommended for 2 additional 1.5 cm left inferior thyroid nodules above. The above is in keeping with the ACR TI-RADS recommendations - J Am Coll Radiol  2017;14:[id number]. Electronically Signed   By: Aletta Edouard M.D.   On: 11/29/2017 09:21   Ir Imaging Guided Port Insertion  Result Date: 11/28/2017 CLINICAL DATA:  Diffuse large B-cell lymphoma, needs durable venous access for treatment regimen EXAM: TUNNELED PORT CATHETER PLACEMENT WITH ULTRASOUND AND FLUOROSCOPIC GUIDANCE FLUOROSCOPY TIME:  0.1 minute; 22 uGym2 DAP ANESTHESIA/SEDATION: Intravenous Fentanyl and Versed were administered as conscious sedation during continuous monitoring of the patient's level of consciousness and physiological / cardiorespiratory status by the radiology RN, with a total moderate sedation time of 15 minutes. TECHNIQUE: The procedure, risks, benefits, and alternatives were explained to the patient. Questions regarding the procedure were encouraged and answered. The patient understands and consents to the procedure. As antibiotic prophylaxis, cefazolin 2 g was ordered pre-procedure and administered intravenously within one hour of incision. Patency of the right IJ vein was confirmed with ultrasound with image documentation. An appropriate skin site was determined. Skin site was marked. Region was prepped using maximum barrier technique including cap and mask, sterile gown, sterile gloves, large sterile sheet, and Chlorhexidine as cutaneous antisepsis. The region was infiltrated locally with 1% lidocaine. Under real-time ultrasound guidance, the right IJ vein was accessed with a 21 gauge micropuncture needle; the needle tip within the vein was confirmed with ultrasound image documentation. Needle was exchanged over a 018 guidewire for transitional dilator which allowed passage of the [facility] wire into the IVC. Over this, the transitional dilator was exchanged for a 5 [place] MPA catheter. A small incision was made on the right anterior chest wall and a subcutaneous pocket fashioned. The power-injectable port was positioned and its catheter tunneled to the right IJ dermatotomy site.  The MPA catheter was exchanged over an Amplatz wire for a peel-away sheath, through which the port catheter, which had been trimmed to the appropriate length, was advanced and positioned under fluoroscopy with its tip at  the cavoatrial junction. Spot chest radiograph confirms good catheter position and no pneumothorax. The pocket was closed with deep interrupted and subcuticular continuous 3-0 Monocryl sutures. The port was flushed per protocol. The incisions were covered with Dermabond then covered with a sterile dressing. COMPLICATIONS: COMPLICATIONS None immediate IMPRESSION: Technically successful right IJ power-injectable port catheter placement. Ready for routine use. Electronically Signed   By: Lucrezia Europe M.D.   On: 11/28/2017 12:45   [place] Fna Biopsy Soft Tissue, Ea Addt'l Lesion  Result Date: 12/08/2017 INDICATION: 47 year old male with a history lymphoma and multiple bilateral thyroid nodules FDG avid on recent PET-CT. Additionally cervical lymph nodes are FDG avid. He has been referred for bilateral FNA of thyroid nodules and cervical lymph node. EXAM: ULTRASOUND-GUIDED BIOPSY LEFT THYROID NODULE ULTRASOUND GUIDED BIOPSY LEFT CERVICAL LYMPH NODE ULTRASOUND-GUIDED BIOPSY RIGHT THYROID NODULE MEDICATIONS: None. ANESTHESIA/SEDATION: NONE FLUOROSCOPY TIME:  NONE COMPLICATIONS: NONE PROCEDURE: Informed written consent was obtained from the patient after a thorough discussion of the procedural risks, benefits and alternatives. All questions were addressed. Maximal Sterile Barrier Technique was utilized including caps, mask, sterile gowns, sterile gloves, sterile drape, hand hygiene and skin antiseptic. A timeout was performed prior to the initiation of the procedure. Patient positioned supine position on the ultrasound table. Images of the bilateral neck were performed with images stored sent to PACs. The neck was prepped and draped with the usual sterile fashion using chlorhexidine. 1% lidocaine was used for  local anesthesia. On the left, 1% lidocaine was used for local anesthesia, and multiple FNA using 25 gauge needle were performed of the targeted left thyroid nodule. Images were stored. Samples were handed to the cytotechnologist for slide preparation and also 4 Afirma testing. We then used ultrasound for a targeted FNA of left-sided cervical lymph nodes. Slide preparation was performed. We then moved to the right neck with 1% lidocaine used for local anesthesia. Using ultrasound guidance, multiple 25 gauge needle were performed of the targeted right thyroid nodule. Images were stored. Samples were handed to the cytotechnologist for slide preparation in for Afirma testing. Final images were stored. Sterile bandage was were placed. Patient tolerated the procedure well and remained hemodynamically stable throughout. No complications were encountered and no significant blood loss. IMPRESSION: Status post ultrasound-guided fine-needle biopsy of bilateral thyroid nodules, targeting the nodules identified on prior ultrasound survey, presumably FDG G avid on recent PET-CT. Status post ultrasound-guided fine-needle biopsy of left cervical lymph node, presumably the lymph nodes which are FDG avid on PET-CT. Signed, Dulcy Fanny. Dellia Nims, RPVI [facility] Radiology Electronically Signed   By: Corrie Mckusick D.O.   On: 12/08/2017 10:32    ASSESSMENT & PLAN:  47 y.o. male with  1. Recently diagnosed Diffuse Large B-Cell Lymphoma Presenting with left breast lesion and left axillary Lnadenopathy  10/31/17 Left breast and left axilla LN pathologies which revealed Diffuse Large B-Cell Lymphoma with germinal center subtype and Ki67 of 40%   11/25/17 PET/CT revealed Hypermetabolic left axillary adenopathy and outer left breast mass, compatible with biopsy-proven lymphoma. 2. Hypermetabolic bilateral thyroid lobe nodules and hypermetabolic left levels 4 and 7 neck lymph nodes.  These findings could all be due to involvement by lymphoma, although the possibility of a second primary malignancy of the thyroid cannot be excluded. 3. No additional hypermetabolic sites of disease. Normal size and activity of the spleen. 4. Right adrenal adenoma.   11/14/17 Hep B, Hep C, and HIV were all negative  11/21/17 Brain MRI  which revealed Normal examination.  No evidence of malignant involvement.  11/21/17 ECHO revealed a LV EF of 55%-60%  PLAN:  -Discussed pt labwork today, 12/15/17; CBC with WBC at 2.7 and mild neutropenia noted. CMP, LDH, and Uric Acid PENDING.  -Discussed with the patient and his wife that it may be prudent to add a neulasta injection on day 3 to ensure that his counts recover in time and if needed, it will be added.  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  -To aid with mucositis, patient was advised to use 0.5 liter of water, one teaspoon of salt and one teaspoon of baking soda gargled several time a day.  -Discussed the recommendation to avoid crowds and avoid individuals with infections while receiving chemotherapy -Will likely recommend 6 cycles of R-CHOP, though will discuss staging further with radiologist for consideration of 3 cycles of R-CHOP and RT -Pt does not meet high-risk criteria for CNS involvement risk and do not recommend prophylactic intrathecal methotrexate. Not a   double hit lymphoma  2. Metastatic Papillary Metastatic Thyroid Carcinoma  -Discussed with the patient regarding various treatment methods to aid with treating lymphoma and metastatic papillary thyroid carcinoma.  -Discussed with the patient and his wife regarding local lymph node dissection,  surgical resection of thyroid, and potential radioactive iodine scan.  -Will refer the patient to an Endocrinologist as well as an ENT Specialist for further evaluation.  ENT referral to Dr Lucia Gaskins newly diagnosed papillary thyroid cancer Endocrine referral to Dr Chalmers Cater   Orders Placed This Encounter  Procedures  . CBC with Differential/Platelet    Standing Status:   Future    Standing Expiration Date:   01/19/2019  . CMP ([place] only)    Standing Status:   Future    Standing Expiration Date:   12/16/2018  . Lactate dehydrogenase    Standing Status:   Future    Standing Expiration Date:   12/16/2018  . Ambulatory referral to ENT    Referral Priority:   Routine    Referral Type:   Consultation    Referral Reason:   Specialty Services Required    Referred to Provider:   Rozetta Nunnery, MD    Requested Specialty:   Otolaryngology    Number of Visits Requested:   1  . Ambulatory referral to Endocrinology    Referral Priority:   Routine    Referral Type:   Consultation    Referral Reason:   Specialty Services Required    Referred to Provider:   Jacelyn Pi, MD    Requested Specialty:   Endocrinology    Number of Visits Requested:   1    ENT referral to Dr Lucia Gaskins newly diagnosed papillary thyroid cancer Endocrine referral to Dr Chalmers Cater RTC with Dr Irene Limbo with labs with C2D1 of R-CHOP chemotherapy Ctx currently scheduled for 10/16 patient prefers to move this to 10/14 (Monday) alongwith labs and clinic visit if possible.   All of the patients questions were answered with apparent satisfaction. The patient knows to call the clinic with any problems, questions or concerns.  The total time spent in the appt was 40 minutes and more than 50% was on counseling and direct patient cares.  Sullivan Lone MD MS AAHIVMS [facility] Hematology/Oncology Physician [facility]  (Office):       [phone] (Work cell):  [phone] (Fax):            [phone]  12/12/2017 11:25 AM  I, Soijett Blue am acting as scribe for Dr. Sullivan Lone.  .I have reviewed the above documentation for accuracy and completeness, and I agree with the above. Brunetta Genera MD

## 2017-12-15 ENCOUNTER — Inpatient Hospital Stay (HOSPITAL_BASED_OUTPATIENT_CLINIC_OR_DEPARTMENT_OTHER): Payer: 59 | Admitting: Hematology

## 2017-12-15 ENCOUNTER — Inpatient Hospital Stay: Payer: 59

## 2017-12-15 ENCOUNTER — Inpatient Hospital Stay: Payer: 59 | Attending: Hematology

## 2017-12-15 VITALS — BP 136/94 | HR 63 | Temp 98.3°F | Resp 18 | Ht 68.0 in | Wt 190.5 lb

## 2017-12-15 DIAGNOSIS — Z5111 Encounter for antineoplastic chemotherapy: Secondary | ICD-10-CM | POA: Diagnosis not present

## 2017-12-15 DIAGNOSIS — R51 Headache: Secondary | ICD-10-CM | POA: Insufficient documentation

## 2017-12-15 DIAGNOSIS — C73 Malignant neoplasm of thyroid gland: Secondary | ICD-10-CM | POA: Insufficient documentation

## 2017-12-15 DIAGNOSIS — Z79899 Other long term (current) drug therapy: Secondary | ICD-10-CM | POA: Diagnosis not present

## 2017-12-15 DIAGNOSIS — I10 Essential (primary) hypertension: Secondary | ICD-10-CM | POA: Insufficient documentation

## 2017-12-15 DIAGNOSIS — Z882 Allergy status to sulfonamides status: Secondary | ICD-10-CM | POA: Diagnosis not present

## 2017-12-15 DIAGNOSIS — Z5112 Encounter for antineoplastic immunotherapy: Secondary | ICD-10-CM | POA: Diagnosis not present

## 2017-12-15 DIAGNOSIS — C8338 Diffuse large B-cell lymphoma, lymph nodes of multiple sites: Secondary | ICD-10-CM

## 2017-12-15 DIAGNOSIS — F419 Anxiety disorder, unspecified: Secondary | ICD-10-CM

## 2017-12-15 DIAGNOSIS — Z7952 Long term (current) use of systemic steroids: Secondary | ICD-10-CM

## 2017-12-15 DIAGNOSIS — E7800 Pure hypercholesterolemia, unspecified: Secondary | ICD-10-CM | POA: Insufficient documentation

## 2017-12-15 DIAGNOSIS — R200 Anesthesia of skin: Secondary | ICD-10-CM | POA: Insufficient documentation

## 2017-12-15 DIAGNOSIS — K1230 Oral mucositis (ulcerative), unspecified: Secondary | ICD-10-CM | POA: Insufficient documentation

## 2017-12-15 DIAGNOSIS — Z95828 Presence of other vascular implants and grafts: Secondary | ICD-10-CM

## 2017-12-15 DIAGNOSIS — K5900 Constipation, unspecified: Secondary | ICD-10-CM | POA: Diagnosis not present

## 2017-12-15 LAB — CBC WITH DIFFERENTIAL (CANCER CENTER ONLY)
Basophils Absolute: 0 10*3/uL (ref 0.0–0.1)
Basophils Relative: 1 %
EOS ABS: 0.1 10*3/uL (ref 0.0–0.5)
EOS PCT: 3 %
HCT: 42.5 % (ref 38.4–49.9)
Hemoglobin: 14.2 g/dL (ref 13.0–17.1)
LYMPHS ABS: 1.2 10*3/uL (ref 0.9–3.3)
LYMPHS PCT: 43 %
MCH: 29.4 pg (ref 27.2–33.4)
MCHC: 33.4 g/dL (ref 32.0–36.0)
MCV: 88.1 fL (ref 79.3–98.0)
Monocytes Absolute: 0.4 10*3/uL (ref 0.1–0.9)
Monocytes Relative: 14 %
Neutro Abs: 1 10*3/uL — ABNORMAL LOW (ref 1.5–6.5)
Neutrophils Relative %: 39 %
PLATELETS: 196 10*3/uL (ref 140–400)
RBC: 4.82 MIL/uL (ref 4.20–5.82)
RDW: 13.7 % (ref 11.0–14.6)
WBC: 2.7 10*3/uL — AB (ref 4.0–10.3)

## 2017-12-15 LAB — CMP (CANCER CENTER ONLY)
ALT: 33 U/L (ref 0–44)
AST: 26 U/L (ref 15–41)
Albumin: 4 g/dL (ref 3.5–5.0)
Alkaline Phosphatase: 62 U/L (ref 38–126)
Anion gap: 8 (ref 5–15)
BUN: 17 mg/dL (ref 6–20)
CHLORIDE: 106 mmol/L (ref 98–111)
CO2: 26 mmol/L (ref 22–32)
CREATININE: 0.87 mg/dL (ref 0.61–1.24)
Calcium: 9.4 mg/dL (ref 8.9–10.3)
GFR, Est AFR Am: >60 mL/min (ref >60)
Glucose, Bld: 96 mg/dL (ref 70–99)
POTASSIUM: 3.8 mmol/L (ref 3.5–5.1)
Sodium: 140 mmol/L (ref 135–145)
Total Bilirubin: 0.4 mg/dL (ref 0.3–1.2)
Total Protein: 7.2 g/dL (ref 6.5–8.1)

## 2017-12-15 LAB — LACTATE DEHYDROGENASE: LDH: 168 U/L (ref 98–192)

## 2017-12-15 LAB — URIC ACID: URIC ACID, SERUM: 4.1 mg/dL (ref 3.7–8.6)

## 2017-12-15 MED ORDER — HEPARIN SOD (PORK) LOCK FLUSH 100 UNIT/ML IV SOLN
500.0000 [IU] | Freq: Once | INTRAVENOUS | Status: AC
  Administered 2017-12-15: 500 [IU] via INTRAVENOUS
  Filled 2017-12-15: qty 5

## 2017-12-15 MED ORDER — SODIUM CHLORIDE 0.9% FLUSH
10.0000 mL | INTRAVENOUS | Status: DC | PRN
  Administered 2017-12-15: 10 mL via INTRAVENOUS
  Filled 2017-12-15: qty 10

## 2017-12-16 ENCOUNTER — Encounter: Payer: Self-pay | Admitting: Hematology

## 2017-12-16 ENCOUNTER — Telehealth: Payer: Self-pay

## 2017-12-16 ENCOUNTER — Telehealth: Payer: Self-pay | Admitting: *Deleted

## 2017-12-16 NOTE — Telephone Encounter (Signed)
Faxed both referral/ and proficient. Per 10/7 los. Was unable to move appointment due to infusion full and possible chance patient having a split appointment. Will call and speak to patient concerning his upcoming appointments being scheduled on Mondays

## 2017-12-16 NOTE — Telephone Encounter (Signed)
Dr. Irene Limbo referred pt to Dr. Suzette Battiest. Omega from Dr. Christeen Douglas office requested pt labs faxed. Labs faxed to [phone]. Fax confirmation received 12/16/17 at 1615.

## 2017-12-18 ENCOUNTER — Telehealth: Payer: Self-pay

## 2017-12-18 ENCOUNTER — Telehealth: Payer: Self-pay | Admitting: Hematology

## 2017-12-18 NOTE — Telephone Encounter (Signed)
Called regarding change and MD said it was ok 11/4 instead of 11/6

## 2017-12-18 NOTE — Telephone Encounter (Signed)
Received VM from pt regarding incorrect appointments. Discussed with Jones Bales. Pt had already been contacted with updated appts. Unable to change 12/24/17 schedule to 12/22/17. Pt requested Mondays for cycle 3 and moving forward. Ok per Dr. Irene Limbo. Cycle 3 scheduled for 01/12/18.

## 2017-12-19 DIAGNOSIS — C73 Malignant neoplasm of thyroid gland: Secondary | ICD-10-CM | POA: Diagnosis not present

## 2017-12-22 ENCOUNTER — Other Ambulatory Visit: Payer: Self-pay | Admitting: Hematology

## 2017-12-22 ENCOUNTER — Ambulatory Visit: Payer: 59 | Admitting: Hematology

## 2017-12-22 ENCOUNTER — Other Ambulatory Visit: Payer: 59

## 2017-12-23 NOTE — Progress Notes (Signed)
HEMATOLOGY/ONCOLOGY CONSULTATION NOTE  Date of Service: 12/24/2017  Patient Care Team: Alex Melter, MD as PCP - General (Family Medicine)  CHIEF COMPLAINTS/PURPOSE OF CONSULTATION:  recently diagnosed Diffuse Large B-Cell Lymphoma   HISTORY OF PRESENTING ILLNESS:   Alex Mendoza is a wonderful 47 y.o. male who has been referred to [place] by Alex Morin, NP for evaluation and management of Diffuse Large B-Cell Lymphoma. He is accompanied today by his wife. The pt reports that he is doing well overall.   The pt presented to care with his PCP Alex Mendoza on 10/29/17 complaining of a swollen left armpit and a lump on left pectoral area that had not changed in 12 months. He first noted the left armpit 3 weeks ago, which he notes is growing quickly.   The pt reports that he occasionally gets headaches that present with some numbness and other feelings that he notes are difficult to describe. He notes that these headaches have worsened over the last 6 months and have caused him to miss work. He notes that these headaches seem to concentrate in the center of his forehead and he has needed to use up to 4-6 aspirin. He notes that his headaches have been evaluated in January 2018 with a MRI Brain which was normal.   The pt notes that he did have a deer tick bite in July 2017 and received prophylactic antibiotics.   The pt notes that he stays very active and eats healthy in general. He notes that he has anxiety and tries to mitigate his anxiety with cardio and staying active. He also recently began Buspirone and notes that he has been sleeping better in the last week.   Of note prior to the patient's visit today, pt has had Left breast nodule and Left axillary biopsies completed on 10/31/17 with results revealing Diffuse Large B-Cell Lymphoma.   Most recent lab results (10/29/17) of CBC is as follows: all values are WNL except for ANC at 8.5k.   On review of systems, pt reports staying active,  worsened headaches over 6 months, good energy levels, some anxiety, and denies skin rashes, drenching night sweats, fevers, chills, unexpected weight loss, abdominal pains, SOB, chest discomfort, leg swelling, joint pain and swelling, and any other symptoms.   On PMHx the pt reports borderline hypertension, hyperlipidemia, occasional somnambulism, choleycystectomy in 2007, left knee surgery. He denies sleep apnea.  On Social Hx the pt denies any ETOH consumption and denies ever smoking.  On Family Hx the pt reports paternal aunt with brain tumor, and denies blood or cancer disorders or CAD.  He reports sulfa antibiotic allergies with whole body rash.  Interval History:   Alex Mendoza returns today for management, evaluation, and C2D1 R-CHOP treatment of his Diffuse Large B-Cell Lymphoma. The patient's last visit with [place] was on 12/15/17. He is accompanied today by his wife. The pt reports that he is doing well overall.   The pt reports that he has kept busy and has continued to stay active. He endorses good energy levels, eating well, and has been staying well hydrated. The pt notes that he has been able to establish care with ENT Dr. Lucia Gaskins in the interim for care of his recently diagnosed papillary thyroid cancer. He has an appointment with Dr. Chalmers Cater in endocrinology on 01/08/18.   The pt adds that he his previous enlarged lymph node in his left armpit has reduced in size and he is no longer able to feel it well.  The pt notes that on one day during the first cycle, he experience some mild tingling in his feet and fingers which resolved on its own.  The pt notes that he has been constipated in the interim and has not yet taken anything for this.   Lab results today (12/24/17) of CBC w/diff, CMP is as follows: all values are WNL except for Abs immature granulocytes at 0.27k, BUN at 22. 12/24/17 LDH is at 214  On review of systems, pt reports good energy levels, eating well, staying  hydrated, constipation, and denies fevers, chills, night sweats, concerns for infections, mouth sores, leg swelling, abdominal pain, and any other symptoms.    MEDICAL HISTORY:  Past Medical History:  Diagnosis Date  . Bradycardia 07/20/2015  . Hypercholesteremia   . Hypertension   . Lymphoma, diffuse ([place])   . Palpitations 07/20/2015    SURGICAL HISTORY: Past Surgical History:  Procedure Laterality Date  . DG GALL BLADDER    . IR IMAGING GUIDED PORT INSERTION  11/28/2017  . KNEE SURGERY Right   . SHOULDER SURGERY Left     SOCIAL HISTORY: Social History   Socioeconomic History  . Marital status: Married    Spouse name: Alex Mendoza  . Number of children: 2  . Years of education: 44  . Highest education level: Not on file  Occupational History    Comment: [employer]  Social Needs  . Financial resource strain: Not on file  . Food insecurity:    Worry: Not on file    Inability: Not on file  . Transportation needs:    Medical: Not on file    Non-medical: Not on file  Tobacco Use  . Smoking status: Never Smoker  . Smokeless tobacco: Never Used  Substance and Sexual Activity  . Alcohol use: No    Alcohol/week: 0.0 standard drinks  . Drug use: No  . Sexual activity: Not on file  Lifestyle  . Physical activity:    Days per week: Not on file    Minutes per session: Not on file  . Stress: Not on file  Relationships  . Social connections:    Talks on phone: Not on file    Gets together: Not on file    Attends religious service: Not on file    Active member of club or organization: Not on file    Attends meetings of clubs or organizations: Not on file    Relationship status: Not on file  . Intimate partner violence:    Fear of current or ex partner: Not on file    Emotionally abused: Not on file    Physically abused: Not on file    Forced sexual activity: Not on file  Other Topics Concern  . Not on file  Social History Narrative   Lives with wife, family   Caffeine- some  chocolate    FAMILY HISTORY: Family History  Problem Relation Age of Onset  . Atrial fibrillation Mother   . Heart failure Maternal Grandmother   . Heart failure Paternal Grandmother   . Bradycardia Father   . Cancer Paternal Aunt     ALLERGIES:  is allergic to sulfa antibiotics.  MEDICATIONS:  Current Outpatient Medications  Medication Sig Dispense Refill  . lidocaine-prilocaine (EMLA) cream Apply to affected area once 30 g 3  . ondansetron (ZOFRAN) 8 MG tablet Take 1 tablet (8 mg total) by mouth 2 (two) times daily as needed for refractory nausea / vomiting. Start on day 3 after chemotherapy. [place]  tablet 1  . predniSONE (DELTASONE) 20 MG tablet Take 3 tablets (60 mg total) by mouth daily. Take on days 1-5 of chemotherapy. 15 tablet 6  . prochlorperazine (COMPAZINE) 10 MG tablet Take 1 tablet (10 mg total) by mouth every 6 (six) hours as needed (Nausea or vomiting). 30 tablet 6  . quinapril (ACCUPRIL) 20 MG tablet Take 20 mg by mouth every other day.    Marland Kitchen LORazepam (ATIVAN) 0.5 MG tablet Take 1 tablet (0.5 mg total) by mouth every 6 (six) hours as needed (Nausea or vomiting). (Patient not taking: Reported on 12/24/2017) 30 tablet 0  . rosuvastatin (CRESTOR) 10 MG tablet Take 10 mg by mouth daily.     No current facility-administered medications for this visit.    Facility-Administered Medications Ordered in Other Visits  Medication Dose Route Frequency Provider Last Rate Last Dose  . gadopentetate dimeglumine (MAGNEVIST) injection 18 mL  18 mL Intravenous Once PRN Penumalli, Earlean Polka, MD        REVIEW OF SYSTEMS:    A 10+ POINT REVIEW OF SYSTEMS WAS OBTAINED including neurology, dermatology, psychiatry, cardiac, respiratory, lymph, extremities, GI, GU, Musculoskeletal, constitutional, breasts, reproductive, HEENT.  All pertinent positives are noted in the HPI.  All others are negative.   PHYSICAL EXAMINATION: ECOG PERFORMANCE STATUS: 1 - Symptomatic but completely  ambulatory  . Vitals:   12/24/17 1025  BP: (!) 139/93  Pulse: 77  Resp: 18  Temp: 98.2 F (36.8 C)  SpO2: 99%   Filed Weights   12/24/17 1025  Weight: 193 lb 3.2 oz (87.6 kg)   .Body mass index is 29.38 kg/m.  GENERAL:alert, in no acute distress and comfortable SKIN: no acute rashes, no significant lesions EYES: conjunctiva are pink and non-injected, sclera anicteric OROPHARYNX: MMM, no exudates, no oropharyngeal erythema or ulceration NECK: supple, no JVD LYMPH:  4-5cm LN in left axilla, 2-3cm LN in chest wall, no palpable lymphadenopathy in the cervical or inguinal regions LUNGS: clear to auscultation b/l with normal respiratory effort HEART: regular rate & rhythm ABDOMEN:  normoactive bowel sounds , non tender, not distended. No palpable hepatosplenomegaly.  Extremity: no pedal edema PSYCH: alert & oriented x 3 with fluent speech NEURO: no focal motor/sensory deficits   LABORATORY DATA:  I have reviewed the data as listed  . CBC Latest Ref Rng & Units 12/24/2017 12/15/2017 12/08/2017  WBC 4.0 - 10.5 K/uL 6.6 2.7(L) 7.9  Hemoglobin 13.0 - 17.0 g/dL 15.1 14.2 14.4  Hematocrit 39.0 - 52.0 % 45.1 42.5 43.9  Platelets 150 - 400 K/uL 283 196 202   Component     Latest Ref Rng & Units 11/14/2017  Sodium     135 - 145 mmol/L 142  Potassium     3.5 - 5.1 mmol/L 4.2  Chloride     98 - 111 mmol/L 108  CO2     22 - 32 mmol/L 24  Glucose     70 - 99 mg/dL 94  BUN     6 - 20 mg/dL 19  Creatinine     0.61 - 1.24 mg/dL 1.11  Calcium     8.9 - 10.3 mg/dL 9.4  Total Protein     6.5 - 8.1 g/dL 7.1  Albumin     3.5 - 5.0 g/dL 4.1  AST     15 - 41 U/L 51 (H)  ALT     0 - 44 U/L 50 (H)  Alkaline Phosphatase     38 - 126 U/L  57  Total Bilirubin     0.3 - 1.2 mg/dL 0.7  GFR, Est Non African American     >60 mL/min >60  GFR, Est African American     >60 mL/min >60  Anion gap     5 - 15 10  LDH     98 - 192 U/L 219 (H)  HCV Ab     0.0 - 0.9 s/co ratio 0.1  Hep B  Core Ab, Tot     Negative Negative  Hepatitis B Surface Ag     Negative Negative  HIV Screen 4th Generation wRfx     Non Reactive Non Reactive   12/08/17 Biopsy:       10/31/17 Left Breast and Left Axilla Biopsies:   11/17/2017 Genetic Testing:    RADIOGRAPHIC STUDIES: I have personally reviewed the radiological images as listed and agreed with the findings in the report. Nm Pet Image Initial (pi) Skull Base To Thigh  Result Date: 11/26/2017 CLINICAL DATA:  Initial treatment strategy for diffuse large B-cell lymphoma. EXAM: NUCLEAR MEDICINE PET SKULL BASE TO THIGH TECHNIQUE: 10.4 mCi F-18 FDG was injected intravenously. Full-ring PET imaging was performed from the skull base to thigh after the radiotracer. CT data was obtained and used for attenuation correction and anatomic localization. Fasting blood glucose: 91 mg/dl COMPARISON:  None. FINDINGS: Mediastinal blood pool activity: SUV max 2.3 NECK: There are hypermetabolic bilateral thyroid lobe hypodense nodules, largest 1.5 cm in upper left thyroid lobe with max SUV 28.1 (series 4/image 40) and 1.3 cm with max SUV 18.4 in the lower right thyroid lobe (series 4/image 52). There is a hypermetabolic calcified 0.7 cm left level 7 (delphian) neck lymph node posterior and inferior to the left thyroid lobe with max SUV 5.3 (series 4/image 54). There are clustered hypermetabolic left level 4 neck lymph nodes, largest 1.2 cm with max SUV 6.5 (series 4/image 52). Incidental CT findings: none CHEST: Hypermetabolic 1.3 cm subcutaneous outer left breast soft tissue mass with max SUV 8.1 (series 4/image 67), containing internal biopsy clip. Moderate hypermetabolic left axillary adenopathy, with dominant 2.3 cm left axillary node with max SUV 21.9 (series 4/image 64). No hypermetabolic right axillary, mediastinal or hilar nodes. No hypermetabolic pulmonary findings. Incidental CT findings: No acute consolidative airspace disease, lung masses or significant  pulmonary nodules. ABDOMEN/PELVIS: No abnormal hypermetabolic activity within the liver, pancreas, adrenal glands, or spleen. No hypermetabolic lymph nodes in the abdomen or pelvis. Incidental CT findings: Cholecystectomy. Right adrenal 2.5 cm adenoma with density 3 HU. SKELETON: No focal hypermetabolic activity to suggest skeletal metastasis. Incidental CT findings: Soft tissue anchors are noted in the right glenoid. IMPRESSION: 1. Hypermetabolic left axillary adenopathy and outer left breast mass, compatible with biopsy-proven lymphoma. 2. Hypermetabolic bilateral thyroid lobe nodules and hypermetabolic left levels 4 and 7 neck lymph nodes. These findings could all be due to involvement by lymphoma, although the possibility of a second primary malignancy of the thyroid cannot be excluded. 3. No additional hypermetabolic sites of disease. Normal size and activity of the spleen. 4. Right adrenal adenoma. Electronically Signed   By: Ilona Sorrel M.D.   On: 11/26/2017 09:36   [place] Fna Bx Thyroid 1st Lesion Afirma  Result Date: 12/08/2017 INDICATION: 47 year old male with a history lymphoma and multiple bilateral thyroid nodules FDG avid on recent PET-CT. Additionally cervical lymph nodes are FDG avid. He has been referred for bilateral FNA of thyroid nodules and cervical lymph node. EXAM: ULTRASOUND-GUIDED BIOPSY LEFT THYROID NODULE ULTRASOUND  GUIDED BIOPSY LEFT CERVICAL LYMPH NODE ULTRASOUND-GUIDED BIOPSY RIGHT THYROID NODULE MEDICATIONS: None. ANESTHESIA/SEDATION: NONE FLUOROSCOPY TIME:  NONE COMPLICATIONS: NONE PROCEDURE: Informed written consent was obtained from the patient after a thorough discussion of the procedural risks, benefits and alternatives. All questions were addressed. Maximal Sterile Barrier Technique was utilized including caps, mask, sterile gowns, sterile gloves, sterile drape, hand hygiene and skin antiseptic. A timeout was performed prior to the initiation of the procedure. Patient positioned  supine position on the ultrasound table. Images of the bilateral neck were performed with images stored sent to PACs. The neck was prepped and draped with the usual sterile fashion using chlorhexidine. 1% lidocaine was used for local anesthesia. On the left, 1% lidocaine was used for local anesthesia, and multiple FNA using 25 gauge needle were performed of the targeted left thyroid nodule. Images were stored. Samples were handed to the cytotechnologist for slide preparation and also 4 Afirma testing. We then used ultrasound for a targeted FNA of left-sided cervical lymph nodes. Slide preparation was performed. We then moved to the right neck with 1% lidocaine used for local anesthesia. Using ultrasound guidance, multiple 25 gauge needle were performed of the targeted right thyroid nodule. Images were stored. Samples were handed to the cytotechnologist for slide preparation in for Afirma testing. Final images were stored. Sterile bandage was were placed. Patient tolerated the procedure well and remained hemodynamically stable throughout. No complications were encountered and no significant blood loss. IMPRESSION: Status post ultrasound-guided fine-needle biopsy of bilateral thyroid nodules, targeting the nodules identified on prior ultrasound survey, presumably FDG G avid on recent PET-CT. Status post ultrasound-guided fine-needle biopsy of left cervical lymph node, presumably the lymph nodes which are FDG avid on PET-CT. Signed, Dulcy Fanny. Dellia Nims, RPVI [facility] Radiology Electronically Signed   By: Corrie Mckusick D.O.   On: 12/08/2017 10:32   [place] Core Biopsy (lymph Nodes)  Result Date: 12/08/2017 INDICATION: 47 year old male with a history lymphoma and multiple bilateral thyroid nodules FDG avid on recent PET-CT. Additionally cervical lymph nodes are FDG avid. He has been referred for bilateral FNA of thyroid nodules and cervical lymph node. EXAM:  ULTRASOUND-GUIDED BIOPSY LEFT THYROID NODULE ULTRASOUND GUIDED BIOPSY LEFT CERVICAL LYMPH NODE ULTRASOUND-GUIDED BIOPSY RIGHT THYROID NODULE MEDICATIONS: None. ANESTHESIA/SEDATION: NONE FLUOROSCOPY TIME:  NONE COMPLICATIONS: NONE PROCEDURE: Informed written consent was obtained from the patient after a thorough discussion of the procedural risks, benefits and alternatives. All questions were addressed. Maximal Sterile Barrier Technique was utilized including caps, mask, sterile gowns, sterile gloves, sterile drape, hand hygiene and skin antiseptic. A timeout was performed prior to the initiation of the procedure. Patient positioned supine position on the ultrasound table. Images of the bilateral neck were performed with images stored sent to PACs. The neck was prepped and draped with the usual sterile fashion using chlorhexidine. 1% lidocaine was used for local anesthesia. On the left, 1% lidocaine was used for local anesthesia, and multiple FNA using 25 gauge needle were performed of the targeted left thyroid nodule. Images were stored. Samples were handed to the cytotechnologist for slide preparation and also 4 Afirma testing. We then used ultrasound for a targeted FNA of left-sided cervical lymph nodes. Slide preparation was performed. We then moved to the right neck with 1% lidocaine used for local anesthesia. Using ultrasound guidance, multiple 25 gauge needle were performed of the targeted right thyroid nodule. Images were stored. Samples were handed to the cytotechnologist for slide preparation in for Afirma testing.  Final images were stored. Sterile bandage was were placed. Patient tolerated the procedure well and remained hemodynamically stable throughout. No complications were encountered and no significant blood loss. IMPRESSION: Status post ultrasound-guided fine-needle biopsy of bilateral thyroid nodules, targeting the nodules identified on prior ultrasound survey, presumably FDG G avid on recent  PET-CT. Status post ultrasound-guided fine-needle biopsy of left cervical lymph node, presumably the lymph nodes which are FDG avid on PET-CT. Signed, Dulcy Fanny. Dellia Nims, RPVI [facility] Radiology Electronically Signed   By: Corrie Mckusick D.O.   On: 12/08/2017 10:32   US Thyroid  Result Date: 11/29/2017 CLINICAL DATA:  Incidental on PET. Diffuse large B-cell lymphoma proven by biopsy of left axillary lymph node and left breast/chest wall mass. PET evaluation demonstrates bilateral hypermetabolic thyroid nodules. EXAM: THYROID ULTRASOUND TECHNIQUE: Ultrasound examination of the thyroid gland and adjacent soft tissues was performed. COMPARISON:  PET scan on 11/25/2017 FINDINGS: Parenchymal Echotexture: Mildly heterogenous Isthmus: 0.2 cm Right lobe: 5.0 x 2.8 x 2.2 cm Left lobe: 4.7 x 2.3 x 2.7 cm _________________________________________________________ Estimated total number of nodules >/= 1 cm: 6-10 Number of spongiform nodules >/=  2 cm not described below (TR1): 0 Number of mixed cystic and solid nodules >/= 1.5 cm not described below (TR2): 0 _________________________________________________________ Multiple nodules are present. The 4 largest and most suspicious are characterized formally. Additional comment on other nodules given below. Nodule # 1: Location: Right; Inferior (Labeled right #3 on worksheet) Maximum size: 1.1 cm; Other 2 dimensions: 0.7 x 0.9 cm Composition: solid/almost completely solid (2) Echogenicity: hypoechoic (2) Shape: not taller-than-wide (0) Margins: ill-defined (0) Echogenic foci: punctate echogenic foci (3) ACR TI-RADS total points: 7. ACR TI-RADS risk category: TR5 (>/= 7 points). ACR TI-RADS recommendations: **Given size (>/= 1.0 cm) and appearance, fine needle aspiration of this highly suspicious nodule should be considered based on TI-RADS criteria. This nodule corresponds to the hypermetabolic PET positive nodule on the right.  _________________________________________________________ Nodule # 2: Location: Left; Mid (Labeled left #1 on worksheet) Maximum size: 1.9 cm; Other 2 dimensions: 0.8 x 0.8 cm Composition: solid/almost completely solid (2) Echogenicity: hypoechoic (2) Shape: not taller-than-wide (0) Margins: ill-defined (0) Echogenic foci: peripheral calcifications (2) ACR TI-RADS total points: 6. ACR TI-RADS risk category: TR4 (4-6 points). ACR TI-RADS recommendations: **Given size (>/= 1.5 cm) and appearance, fine needle aspiration of this moderately suspicious nodule should be considered based on TI-RADS criteria. This nodule corresponds to the hypermetabolic PET positive nodule on the left. _________________________________________________________ Nodule # 3: Location: Left; Inferior (Left #2 on worksheet) Maximum size: 1.5 cm; Other 2 dimensions: 1.0 x 1.1 cm Composition: solid/almost completely solid (2) Echogenicity: isoechoic (1) Shape: not taller-than-wide (0) Margins: smooth (0) Echogenic foci: none (0) ACR TI-RADS total points: 3. ACR TI-RADS risk category: TR3 (3 points). ACR TI-RADS recommendations: *Given size (>/= 1.5 - 2.4 cm) and appearance, a follow-up ultrasound in 1 year should be considered based on TI-RADS criteria. _________________________________________________________ Nodule # 4: Location: Left; Inferior (Left #3 on worksheet) Maximum size: 1.5 cm; Other 2 dimensions: 1.3 x 1.3 cm Composition: solid/almost completely solid (2) Echogenicity: hypoechoic (2) Shape: not taller-than-wide (0) Margins: smooth (0) Echogenic foci: none (0) ACR TI-RADS total points: 4. ACR TI-RADS risk category: TR4 (4-6 points). ACR TI-RADS recommendations: **Given size (>/= 1.5 cm) and appearance, fine needle aspiration of this moderately suspicious nodule should be considered based on TI-RADS criteria. This nodule is borderline for fine-needle aspiration and was not PET positive.  _________________________________________________________  There are 2 additional partially cystic nodules in the right lobe and a 1 cm solid nodule. These do not meet criteria for fine-needle aspiration currently. Additional area measured up to 1.7 cm in the right inferior lobe is very ill-defined and likely represents an area of heterogeneity rather than a true discrete nodule. Multiple scattered small lymph nodes are seen bilaterally in the neck. IMPRESSION: 1. 1.1 cm right inferior thyroid nodule meets criteria for fine-needle aspiration and corresponds to the hypermetabolic right-sided nodule by PET scan. This nodule contains punctate echogenic foci. 2. 1.9 cm left mid thyroid nodule meets criteria for fine-needle aspiration and corresponds to the hypermetabolic left-sided nodule by PET scan. This nodule demonstrates prominent peripheral calcification and some internal calcification. 3. Only recommend fine-needle aspiration currently of the 2 above nodules. A 1 year follow-up ultrasound is recommended for 2 additional 1.5 cm left inferior thyroid nodules above. The above is in keeping with the ACR TI-RADS recommendations - J Am Coll Radiol 2017;14:587-595. Electronically Signed   By: Aletta Edouard M.D.   On: 11/29/2017 09:21   Ir Imaging Guided Port Insertion  Result Date: 11/28/2017 CLINICAL DATA:  Diffuse large B-cell lymphoma, needs durable venous access for treatment regimen EXAM: TUNNELED PORT CATHETER PLACEMENT WITH ULTRASOUND AND FLUOROSCOPIC GUIDANCE FLUOROSCOPY TIME:  0.1 minute; 22 uGym2 DAP ANESTHESIA/SEDATION: Intravenous Fentanyl and Versed were administered as conscious sedation during continuous monitoring of the patient's level of consciousness and physiological / cardiorespiratory status by the radiology RN, with a total moderate sedation time of 15 minutes. TECHNIQUE: The procedure, risks, benefits, and alternatives were explained to the patient. Questions regarding the procedure were  encouraged and answered. The patient understands and consents to the procedure. As antibiotic prophylaxis, cefazolin 2 g was ordered pre-procedure and administered intravenously within one hour of incision. Patency of the right IJ vein was confirmed with ultrasound with image documentation. An appropriate skin site was determined. Skin site was marked. Region was prepped using maximum barrier technique including cap and mask, sterile gown, sterile gloves, large sterile sheet, and Chlorhexidine as cutaneous antisepsis. The region was infiltrated locally with 1% lidocaine. Under real-time ultrasound guidance, the right IJ vein was accessed with a 21 gauge micropuncture needle; the needle tip within the vein was confirmed with ultrasound image documentation. Needle was exchanged over a 018 guidewire for transitional dilator which allowed passage of the [facility] wire into the IVC. Over this, the transitional dilator was exchanged for a 5 [place] MPA catheter. A small incision was made on the right anterior chest wall and a subcutaneous pocket fashioned. The power-injectable port was positioned and its catheter tunneled to the right IJ dermatotomy site. The MPA catheter was exchanged over an Amplatz wire for a peel-away sheath, through which the port catheter, which had been trimmed to the appropriate length, was advanced and positioned under fluoroscopy with its tip at the cavoatrial junction. Spot chest radiograph confirms good catheter position and no pneumothorax. The pocket was closed with deep interrupted and subcuticular continuous 3-0 Monocryl sutures. The port was flushed per protocol. The incisions were covered with Dermabond then covered with a sterile dressing. COMPLICATIONS: COMPLICATIONS None immediate IMPRESSION: Technically successful right IJ power-injectable port catheter placement. Ready for routine use. Electronically Signed   By: Lucrezia Europe M.D.   On: 11/28/2017 12:45   [place] Fna Biopsy Soft Tissue, Ea  Addt'l Lesion  Result Date: 12/08/2017 INDICATION: 47 year old male with a history lymphoma and multiple bilateral thyroid nodules FDG avid on recent PET-CT. Additionally  cervical lymph nodes are FDG avid. He has been referred for bilateral FNA of thyroid nodules and cervical lymph node. EXAM: ULTRASOUND-GUIDED BIOPSY LEFT THYROID NODULE ULTRASOUND GUIDED BIOPSY LEFT CERVICAL LYMPH NODE ULTRASOUND-GUIDED BIOPSY RIGHT THYROID NODULE MEDICATIONS: None. ANESTHESIA/SEDATION: NONE FLUOROSCOPY TIME:  NONE COMPLICATIONS: NONE PROCEDURE: Informed written consent was obtained from the patient after a thorough discussion of the procedural risks, benefits and alternatives. All questions were addressed. Maximal Sterile Barrier Technique was utilized including caps, mask, sterile gowns, sterile gloves, sterile drape, hand hygiene and skin antiseptic. A timeout was performed prior to the initiation of the procedure. Patient positioned supine position on the ultrasound table. Images of the bilateral neck were performed with images stored sent to PACs. The neck was prepped and draped with the usual sterile fashion using chlorhexidine. 1% lidocaine was used for local anesthesia. On the left, 1% lidocaine was used for local anesthesia, and multiple FNA using 25 gauge needle were performed of the targeted left thyroid nodule. Images were stored. Samples were handed to the cytotechnologist for slide preparation and also 4 Afirma testing. We then used ultrasound for a targeted FNA of left-sided cervical lymph nodes. Slide preparation was performed. We then moved to the right neck with 1% lidocaine used for local anesthesia. Using ultrasound guidance, multiple 25 gauge needle were performed of the targeted right thyroid nodule. Images were stored. Samples were handed to the cytotechnologist for slide preparation in for Afirma testing. Final images were stored. Sterile bandage was were placed. Patient tolerated the procedure well and  remained hemodynamically stable throughout. No complications were encountered and no significant blood loss. IMPRESSION: Status post ultrasound-guided fine-needle biopsy of bilateral thyroid nodules, targeting the nodules identified on prior ultrasound survey, presumably FDG G avid on recent PET-CT. Status post ultrasound-guided fine-needle biopsy of left cervical lymph node, presumably the lymph nodes which are FDG avid on PET-CT. Signed, Dulcy Fanny. Dellia Nims, RPVI [facility] Radiology Electronically Signed   By: Corrie Mckusick D.O.   On: 12/08/2017 10:32    ASSESSMENT & PLAN:  47 y.o. male with  1. Recently diagnosed Diffuse Large B-Cell Lymphoma Presenting with left breast lesion and left axillary Lnadenopathy  10/31/17 Left breast and left axilla LN pathologies which revealed Diffuse Large B-Cell Lymphoma with germinal center subtype and Ki67 of 40%   11/25/17 PET/CT revealed Hypermetabolic left axillary adenopathy and outer left breast mass, compatible with biopsy-proven lymphoma. 2. Hypermetabolic bilateral thyroid lobe nodules and hypermetabolic left levels 4 and 7 neck lymph nodes. These findings could all be due to involvement by lymphoma, although the possibility of a second primary malignancy of the thyroid cannot be excluded. 3. No additional hypermetabolic sites of disease. Normal size and activity of the spleen. 4. Right adrenal adenoma.   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  11/21/17 Brain MRI which revealed Normal examination.  No evidence of malignant involvement.  11/21/17 ECHO revealed a LV EF of 55%-60%   PLAN: -To aid with mucositis, patient was advised to use 0.5 liter of water, one teaspoon of  salt and one teaspoon of baking soda gargled several time a day.  -Discussed the recommendation to avoid crowds and avoid individuals with infections while receiving chemotherapy -Pt does not meet  high-risk criteria for CNS involvement risk and do not recommend prophylactic intrathecal methotrexate. Not a double hit lymphoma -Discussed pt labwork today, 12/24/17; neutropenia resolved, blood counts normal and chemistries are stable  -Patient's initial nadir was not overtly concerning, and neutrophils successfully bounced back, therefore will continue to monitor pt throughout treatment for indication of G-CSF support -The pt has no prohibitive toxicities from continuing C2 R-CHOP at this time.   -Discussed plan to treat pt with 6 cycles for patient's Stage II disease, with referral for discussion with Rad Onc afterwards -Begin Senna S two pills at night for one week, then one pill at night for constipation, recommended stopping Senna S if too frequent bowel movements develop  -Will repeat PET/CT after C3 -Will see the pt back for C3D1   2. Metastatic Papillary Metastatic Thyroid Carcinoma   12/08/17 biopsy results revealed metastatic papillary thyroid carcinoma   -Discussed with the patient regarding various treatment methods to aid with treating lymphoma and metastatic papillary thyroid carcinoma.  -Discussed with the patient and his wife regarding local lymph node dissection, surgical resection of thyroid, and potential radioactive iodine ablation needs for PTC. -ENT referral to Dr Lucia Gaskins for further evaluation of newly diagnosed papillary thyroid cancer done- appreciate input. -Endocrine referral to Dr Chalmers Cater -will defer treatment of PTC until after completion of DLBCL treatment. -will track changes in [facility] on PET as well.   No orders of the defined types were placed in this encounter.   F/u for C3 of R-CHOP on 01/12/2018. plz change the labs and MD visit appointment to 01/09/2018 -ok to  overbook early AM.   All of the patients questions were answered with apparent satisfaction. The patient knows to call the clinic with any problems, questions or concerns.  The total time spent in the appt was 30 minutes and more than 50% was on counseling and direct patient cares.      Sullivan Lone MD MS AAHIVMS [facility] Hematology/Oncology Physician [facility]  (Office):       [phone] (Work cell):  [phone] (Fax):           [phone]  12/24/2017 11:53 AM  I, Baldwin [place], am acting as a scribe for Dr. Irene Limbo  .I have reviewed the above documentation for accuracy and completeness, and I agree with the above. Brunetta Genera MD

## 2017-12-24 ENCOUNTER — Encounter: Payer: Self-pay | Admitting: Hematology

## 2017-12-24 ENCOUNTER — Inpatient Hospital Stay: Payer: 59

## 2017-12-24 ENCOUNTER — Other Ambulatory Visit: Payer: 59

## 2017-12-24 ENCOUNTER — Telehealth: Payer: Self-pay | Admitting: Hematology

## 2017-12-24 ENCOUNTER — Inpatient Hospital Stay (HOSPITAL_BASED_OUTPATIENT_CLINIC_OR_DEPARTMENT_OTHER): Payer: 59 | Admitting: Hematology

## 2017-12-24 VITALS — BP 127/78 | HR 67 | Temp 97.5°F | Resp 17

## 2017-12-24 VITALS — BP 139/93 | HR 77 | Temp 98.2°F | Resp 18 | Ht 68.0 in | Wt 193.2 lb

## 2017-12-24 DIAGNOSIS — C73 Malignant neoplasm of thyroid gland: Secondary | ICD-10-CM

## 2017-12-24 DIAGNOSIS — K5900 Constipation, unspecified: Secondary | ICD-10-CM

## 2017-12-24 DIAGNOSIS — E7800 Pure hypercholesterolemia, unspecified: Secondary | ICD-10-CM

## 2017-12-24 DIAGNOSIS — Z95828 Presence of other vascular implants and grafts: Secondary | ICD-10-CM

## 2017-12-24 DIAGNOSIS — Z79899 Other long term (current) drug therapy: Secondary | ICD-10-CM

## 2017-12-24 DIAGNOSIS — Z882 Allergy status to sulfonamides status: Secondary | ICD-10-CM

## 2017-12-24 DIAGNOSIS — C8338 Diffuse large B-cell lymphoma, lymph nodes of multiple sites: Secondary | ICD-10-CM | POA: Diagnosis not present

## 2017-12-24 DIAGNOSIS — Z7189 Other specified counseling: Secondary | ICD-10-CM

## 2017-12-24 DIAGNOSIS — Z5111 Encounter for antineoplastic chemotherapy: Secondary | ICD-10-CM

## 2017-12-24 DIAGNOSIS — Z7952 Long term (current) use of systemic steroids: Secondary | ICD-10-CM

## 2017-12-24 DIAGNOSIS — I10 Essential (primary) hypertension: Secondary | ICD-10-CM

## 2017-12-24 DIAGNOSIS — F419 Anxiety disorder, unspecified: Secondary | ICD-10-CM

## 2017-12-24 DIAGNOSIS — K1230 Oral mucositis (ulcerative), unspecified: Secondary | ICD-10-CM | POA: Diagnosis not present

## 2017-12-24 LAB — CMP (CANCER CENTER ONLY)
ALT: 24 U/L (ref 0–44)
AST: 22 U/L (ref 15–41)
Albumin: 3.7 g/dL (ref 3.5–5.0)
Alkaline Phosphatase: 59 U/L (ref 38–126)
Anion gap: 9 (ref 5–15)
BUN: 22 mg/dL — ABNORMAL HIGH (ref 6–20)
CHLORIDE: 106 mmol/L (ref 98–111)
CO2: 26 mmol/L (ref 22–32)
Calcium: 9.3 mg/dL (ref 8.9–10.3)
Creatinine: 0.91 mg/dL (ref 0.61–1.24)
Glucose, Bld: 88 mg/dL (ref 70–99)
POTASSIUM: 4.2 mmol/L (ref 3.5–5.1)
SODIUM: 141 mmol/L (ref 135–145)
Total Bilirubin: 0.5 mg/dL (ref 0.3–1.2)
Total Protein: 7.1 g/dL (ref 6.5–8.1)

## 2017-12-24 LAB — CBC WITH DIFFERENTIAL/PLATELET
Abs Immature Granulocytes: 0.27 10*3/uL — ABNORMAL HIGH (ref 0.00–0.07)
Basophils Absolute: 0.1 10*3/uL (ref 0.0–0.1)
Basophils Relative: 1 %
EOS PCT: 1 %
Eosinophils Absolute: 0 10*3/uL (ref 0.0–0.5)
HCT: 45.1 % (ref 39.0–52.0)
HEMOGLOBIN: 15.1 g/dL (ref 13.0–17.0)
Immature Granulocytes: 4 %
LYMPHS ABS: 1.6 10*3/uL (ref 0.7–4.0)
LYMPHS PCT: 25 %
MCH: 29.2 pg (ref 26.0–34.0)
MCHC: 33.5 g/dL (ref 30.0–36.0)
MCV: 87.2 fL (ref 80.0–100.0)
Monocytes Absolute: 1 10*3/uL (ref 0.1–1.0)
Monocytes Relative: 15 %
NEUTROS ABS: 3.6 10*3/uL (ref 1.7–7.7)
NEUTROS PCT: 54 %
Platelets: 283 10*3/uL (ref 150–400)
RBC: 5.17 MIL/uL (ref 4.22–5.81)
RDW: 13.2 % (ref 11.5–15.5)
WBC: 6.6 10*3/uL (ref 4.0–10.5)
nRBC: 0 % (ref 0.0–0.2)

## 2017-12-24 LAB — LACTATE DEHYDROGENASE: LDH: 214 U/L — AB (ref 98–192)

## 2017-12-24 MED ORDER — PALONOSETRON HCL INJECTION 0.25 MG/5ML
INTRAVENOUS | Status: AC
  Filled 2017-12-24: qty 5

## 2017-12-24 MED ORDER — SODIUM CHLORIDE 0.9% FLUSH
10.0000 mL | INTRAVENOUS | Status: DC | PRN
  Administered 2017-12-24: 10 mL via INTRAVENOUS
  Filled 2017-12-24: qty 10

## 2017-12-24 MED ORDER — ACETAMINOPHEN 325 MG PO TABS
ORAL_TABLET | ORAL | Status: AC
  Filled 2017-12-24: qty 2

## 2017-12-24 MED ORDER — SODIUM CHLORIDE 0.9% FLUSH
10.0000 mL | INTRAVENOUS | Status: DC | PRN
  Administered 2017-12-24: 10 mL
  Filled 2017-12-24: qty 10

## 2017-12-24 MED ORDER — SODIUM CHLORIDE 0.9 % IV SOLN
750.0000 mg/m2 | Freq: Once | INTRAVENOUS | Status: AC
  Administered 2017-12-24: 1520 mg via INTRAVENOUS
  Filled 2017-12-24: qty 76

## 2017-12-24 MED ORDER — SODIUM CHLORIDE 0.9 % IV SOLN
Freq: Once | INTRAVENOUS | Status: AC
  Administered 2017-12-24: 13:00:00 via INTRAVENOUS
  Filled 2017-12-24: qty 5

## 2017-12-24 MED ORDER — SODIUM CHLORIDE 0.9 % IV SOLN
Freq: Once | INTRAVENOUS | Status: AC
  Administered 2017-12-24: 12:00:00 via INTRAVENOUS
  Filled 2017-12-24: qty 250

## 2017-12-24 MED ORDER — VINCRISTINE SULFATE CHEMO INJECTION 1 MG/ML
2.0000 mg | Freq: Once | INTRAVENOUS | Status: AC
  Administered 2017-12-24: 2 mg via INTRAVENOUS
  Filled 2017-12-24: qty 2

## 2017-12-24 MED ORDER — DIPHENHYDRAMINE HCL 25 MG PO CAPS
ORAL_CAPSULE | ORAL | Status: AC
  Filled 2017-12-24: qty 2

## 2017-12-24 MED ORDER — DOXORUBICIN HCL CHEMO IV INJECTION 2 MG/ML
50.0000 mg/m2 | Freq: Once | INTRAVENOUS | Status: AC
  Administered 2017-12-24: 102 mg via INTRAVENOUS
  Filled 2017-12-24: qty 51

## 2017-12-24 MED ORDER — DIPHENHYDRAMINE HCL 25 MG PO CAPS
50.0000 mg | ORAL_CAPSULE | Freq: Once | ORAL | Status: AC
  Administered 2017-12-24: 50 mg via ORAL

## 2017-12-24 MED ORDER — ACETAMINOPHEN 325 MG PO TABS
650.0000 mg | ORAL_TABLET | Freq: Once | ORAL | Status: AC
  Administered 2017-12-24: 650 mg via ORAL

## 2017-12-24 MED ORDER — HEPARIN SOD (PORK) LOCK FLUSH 100 UNIT/ML IV SOLN
500.0000 [IU] | Freq: Once | INTRAVENOUS | Status: AC | PRN
  Administered 2017-12-24: 500 [IU]
  Filled 2017-12-24: qty 5

## 2017-12-24 MED ORDER — SODIUM CHLORIDE 0.9 % IV SOLN
375.0000 mg/m2 | Freq: Once | INTRAVENOUS | Status: AC
  Administered 2017-12-24: 800 mg via INTRAVENOUS
  Filled 2017-12-24: qty 30

## 2017-12-24 MED ORDER — PALONOSETRON HCL INJECTION 0.25 MG/5ML
0.2500 mg | Freq: Once | INTRAVENOUS | Status: AC
  Administered 2017-12-24: 0.25 mg via INTRAVENOUS

## 2017-12-24 NOTE — Patient Instructions (Signed)
Implanted Port Home Guide An implanted port is a type of central line that is placed under the skin. Central lines are used to provide IV access when treatment or nutrition needs to be given through a person's veins. Implanted ports are used for long-term IV access. An implanted port may be placed because:  You need IV medicine that would be irritating to the small veins in your hands or arms.  You need long-term IV medicines, such as antibiotics.  You need IV nutrition for a long period.  You need frequent blood draws for lab tests.  You need dialysis.  Implanted ports are usually placed in the chest area, but they can also be placed in the upper arm, the abdomen, or the leg. An implanted port has two main parts:  Reservoir. The reservoir is round and will appear as a small, raised area under your skin. The reservoir is the part where a needle is inserted to give medicines or draw blood.  Catheter. The catheter is a thin, flexible tube that extends from the reservoir. The catheter is placed into a large vein. Medicine that is inserted into the reservoir goes into the catheter and then into the vein.  How will I care for my incision site? Do not get the incision site wet. Bathe or shower as directed by your health care provider. How is my port accessed? Special steps must be taken to access the port:  Before the port is accessed, a numbing cream can be placed on the skin. This helps numb the skin over the port site.  Your health care provider uses a sterile technique to access the port. ? Your health care provider must put on a mask and sterile gloves. ? The skin over your port is cleaned carefully with an antiseptic and allowed to dry. ? The port is gently pinched between sterile gloves, and a needle is inserted into the port.  Only "non-coring" port needles should be used to access the port. Once the port is accessed, a blood return should be checked. This helps ensure that the port  is in the vein and is not clogged.  If your port needs to remain accessed for a constant infusion, a clear (transparent) bandage will be placed over the needle site. The bandage and needle will need to be changed every week, or as directed by your health care provider.  Keep the bandage covering the needle clean and dry. Do not get it wet. Follow your health care provider's instructions on how to take a shower or bath while the port is accessed.  If your port does not need to stay accessed, no bandage is needed over the port.  What is flushing? Flushing helps keep the port from getting clogged. Follow your health care provider's instructions on how and when to flush the port. Ports are usually flushed with saline solution or a medicine called heparin. The need for flushing will depend on how the port is used.  If the port is used for intermittent medicines or blood draws, the port will need to be flushed: ? After medicines have been given. ? After blood has been drawn. ? As part of routine maintenance.  If a constant infusion is running, the port may not need to be flushed.  How long will my port stay implanted? The port can stay in for as long as your health care provider thinks it is needed. When it is time for the port to come out, surgery will be   done to remove it. The procedure is similar to the one performed when the port was put in. When should I seek immediate medical care? When you have an implanted port, you should seek immediate medical care if:  You notice a bad smell coming from the incision site.  You have swelling, redness, or drainage at the incision site.  You have more swelling or pain at the port site or the surrounding area.  You have a fever that is not controlled with medicine.  This information is not intended to replace advice given to you by your health care provider. Make sure you discuss any questions you have with your health care provider. Document  Released: 02/25/2005 Document Revised: 08/03/2015 Document Reviewed: 11/02/2012 Elsevier Interactive Patient Education  2017 Elsevier Inc.  

## 2017-12-24 NOTE — Patient Instructions (Signed)
[facility] Discharge Instructions for Patients Receiving Chemotherapy  Today you received the following chemotherapy agents Adriamycin, Vincristine, Cytoxan, Rituxan.  To help prevent nausea and vomiting after your treatment, we encourage you to take your nausea medication as directed.  If you develop nausea and vomiting that is not controlled by your nausea medication, call the clinic.   BELOW ARE SYMPTOMS THAT SHOULD BE REPORTED IMMEDIATELY:  *FEVER GREATER THAN 100.5 F  *CHILLS WITH OR WITHOUT FEVER  NAUSEA AND VOMITING THAT IS NOT CONTROLLED WITH YOUR NAUSEA MEDICATION  *UNUSUAL SHORTNESS OF BREATH  *UNUSUAL BRUISING OR BLEEDING  TENDERNESS IN MOUTH AND THROAT WITH OR WITHOUT PRESENCE OF ULCERS  *URINARY PROBLEMS  *BOWEL PROBLEMS  UNUSUAL RASH Items with * indicate a potential emergency and should be followed up as soon as possible.  Feel free to call the clinic should you have any questions or concerns. The clinic phone number is [phone].  Please show the CHEMO ALERT CARD at check-in to the Emergency Department and triage nurse.   

## 2017-12-24 NOTE — Telephone Encounter (Signed)
Gave pt avs and calendar. Pt sched in nov OK Per GK to sched the next cycle

## 2017-12-25 MED ORDER — SENNOSIDES-DOCUSATE SODIUM 8.6-50 MG PO TABS: 2.0000 | ORAL_TABLET | Freq: Every day | ORAL | 1 refills | Status: DC

## 2017-12-29 ENCOUNTER — Telehealth: Payer: Self-pay | Admitting: *Deleted

## 2017-12-29 ENCOUNTER — Telehealth: Payer: Self-pay

## 2017-12-29 NOTE — Telephone Encounter (Signed)
Two MyChart messages received from pt regarding FMLA paperwork and lack of flush appt on 01/12/18. Returned messages to pt noting that FMLA paperwork has been given to Ivin Poot in HIM to complete and scheduling message sent to adjust appt on 01/12/18. Pt should receive notification of both. Encouraged to call in a few days if pt has not heard from scheduling [phone]. Stacey's number provided [phone]. Noted that expected turn-around time for FMLA paperwork is 7-10 business days.

## 2017-12-29 NOTE — Telephone Encounter (Signed)
Pt wife, Alex Mendoza, called this morning to relay pt c/o constipation. Pt states, "I am impacted." Asked questions regarding last BM and medications taken. Pt verbalized, "I used two fleets enema this morning, and have been taking the laxative prescribed, but forgot last night." Pt wife verbalized, "He has been straining all morning and is having some blood in his stool." Pt typically has 1-2 BM/day. Pt last had BM this morning, "but it was not normal," as stated by pt. Per Dr. Irene Limbo, pt should start taking miralax OTC, and can continue taking 2 senna-S twice daily. Additionally, pt instructed to take bottle of magnesium citrate to relieve severe constipation. Wife questioned when they should call back. Encouraged to take medications as prescribed. Plan to p/u magnesium citrate this morning. Pt to take and wait at least an hour for results, then call back if no relief noted. MD also recommended for pt to stop using enemas as this can cause an abscess. Pt and wife verbalized understanding of instructions.

## 2017-12-29 NOTE — Telephone Encounter (Addendum)
"  Alex Mendoza [facility] wife [phone]) calling reporting blood in stools.  He's been extremely constipated since Wednesday's chemotherapy treatment."  Margaretha Sheffield reports she has "talked with collaborative nurse.  Will go to pharmacy for magnesium citrate.  He drinks lots of water.  Yesterday took a long walk but forgot to use laxative last night.  Straining with lots of discomfort or impacted.  Will call back in an hour if it doesn't work."  Asked for return call with results.  Could work immediately to four hours.  Reports instructions to drink entire bottle with early results.  Cramps may occur with use.  Will still need to continue Senokot-S, increase fluids and activity to regulate bowels.

## 2018-01-01 NOTE — Progress Notes (Signed)
FMLA successfully faxed to [employer] at [phone]. Mailed copy to patient address on file. 

## 2018-01-02 ENCOUNTER — Other Ambulatory Visit: Payer: Self-pay | Admitting: Hematology

## 2018-01-02 ENCOUNTER — Telehealth: Payer: Self-pay | Admitting: *Deleted

## 2018-01-02 DIAGNOSIS — C8338 Diffuse large B-cell lymphoma, lymph nodes of multiple sites: Secondary | ICD-10-CM

## 2018-01-02 NOTE — Telephone Encounter (Signed)
Patient called to state he wanted to schedule PET scan that MD said he would need between cycle 3 and 4 of treatment. He wanted to go ahead and schedule it now so that he would have a place. MD placed order for scan. Notified patient that order placed and gave him number for Central Scheduling [phone]. Patient states he will call to schedule.

## 2018-01-05 ENCOUNTER — Ambulatory Visit: Payer: 59

## 2018-01-05 ENCOUNTER — Other Ambulatory Visit: Payer: 59

## 2018-01-05 ENCOUNTER — Telehealth: Payer: Self-pay | Admitting: *Deleted

## 2018-01-05 NOTE — Telephone Encounter (Signed)
Oncology Nurse Navigator Documentation  Received call-back from Mr. Lumley, explained my role as a member of his Care Team. He stated:  He is calling Dr. Lucia Gaskins this afternoon to confirm 10/16 TB discussion re plan to treat his thyroid cancer in context of current chemo for lymphoma.  I explained discussion per my notes:  thyroidectomy following final chemo anticipated in January, initiation of RT one month s/p final chemo.  Has an appt with endocrinologist Dr. Valetta Close this Friday. I noted I will meet with him during next week's infusion for further introduction.  Gayleen Orem, RN, BSN Head & Neck Oncology Nurse [place] at [place] [phone]'

## 2018-01-05 NOTE — Telephone Encounter (Signed)
Oncology Nurse Navigator Documentation  Placed navigation introductory call, LVMM asking for call back.  Gayleen Orem, RN, BSN Head & Neck Oncology Nurse [place] at [place] [phone]

## 2018-01-08 ENCOUNTER — Other Ambulatory Visit: Payer: Self-pay | Admitting: *Deleted

## 2018-01-08 ENCOUNTER — Telehealth: Payer: Self-pay | Admitting: Hematology

## 2018-01-08 ENCOUNTER — Encounter: Payer: Self-pay | Admitting: Hematology

## 2018-01-08 DIAGNOSIS — C73 Malignant neoplasm of thyroid gland: Secondary | ICD-10-CM | POA: Diagnosis not present

## 2018-01-08 DIAGNOSIS — C8338 Diffuse large B-cell lymphoma, lymph nodes of multiple sites: Secondary | ICD-10-CM

## 2018-01-08 DIAGNOSIS — D3500 Benign neoplasm of unspecified adrenal gland: Secondary | ICD-10-CM | POA: Diagnosis not present

## 2018-01-08 NOTE — Telephone Encounter (Signed)
Faxed medical records to Dr. Chalmers Cater at [phone], Release [id number]

## 2018-01-09 ENCOUNTER — Inpatient Hospital Stay: Payer: 59 | Attending: Hematology

## 2018-01-09 ENCOUNTER — Inpatient Hospital Stay: Payer: 59

## 2018-01-09 ENCOUNTER — Encounter: Payer: Self-pay | Admitting: Hematology

## 2018-01-09 ENCOUNTER — Inpatient Hospital Stay (HOSPITAL_BASED_OUTPATIENT_CLINIC_OR_DEPARTMENT_OTHER): Payer: 59 | Admitting: Hematology

## 2018-01-09 VITALS — BP 118/91 | HR 98 | Temp 97.8°F | Resp 20 | Ht 68.0 in | Wt 193.9 lb

## 2018-01-09 DIAGNOSIS — Z9221 Personal history of antineoplastic chemotherapy: Secondary | ICD-10-CM | POA: Diagnosis not present

## 2018-01-09 DIAGNOSIS — Z79899 Other long term (current) drug therapy: Secondary | ICD-10-CM | POA: Insufficient documentation

## 2018-01-09 DIAGNOSIS — C8338 Diffuse large B-cell lymphoma, lymph nodes of multiple sites: Secondary | ICD-10-CM | POA: Diagnosis not present

## 2018-01-09 DIAGNOSIS — C73 Malignant neoplasm of thyroid gland: Secondary | ICD-10-CM | POA: Insufficient documentation

## 2018-01-09 DIAGNOSIS — D3501 Benign neoplasm of right adrenal gland: Secondary | ICD-10-CM

## 2018-01-09 DIAGNOSIS — F419 Anxiety disorder, unspecified: Secondary | ICD-10-CM

## 2018-01-09 DIAGNOSIS — Z95828 Presence of other vascular implants and grafts: Secondary | ICD-10-CM

## 2018-01-09 DIAGNOSIS — Z5111 Encounter for antineoplastic chemotherapy: Secondary | ICD-10-CM

## 2018-01-09 LAB — CMP (CANCER CENTER ONLY)
ALK PHOS: 61 U/L (ref 38–126)
ALT: 26 U/L (ref 0–44)
AST: 20 U/L (ref 15–41)
Albumin: 3.6 g/dL (ref 3.5–5.0)
Anion gap: 11 (ref 5–15)
BILIRUBIN TOTAL: 0.7 mg/dL (ref 0.3–1.2)
BUN: 20 mg/dL (ref 6–20)
CALCIUM: 8.9 mg/dL (ref 8.9–10.3)
CO2: 23 mmol/L (ref 22–32)
Chloride: 109 mmol/L (ref 98–111)
Creatinine: 1.06 mg/dL (ref 0.61–1.24)
GFR, Est AFR Am: >60 mL/min (ref >60)
Glucose, Bld: 93 mg/dL (ref 70–99)
Potassium: 3.9 mmol/L (ref 3.5–5.1)
Sodium: 143 mmol/L (ref 135–145)
TOTAL PROTEIN: 6.7 g/dL (ref 6.5–8.1)

## 2018-01-09 LAB — CBC WITH DIFFERENTIAL (CANCER CENTER ONLY)
ABS IMMATURE GRANULOCYTES: 0.1 10*3/uL — AB (ref 0.00–0.07)
BASOS PCT: 1 %
Basophils Absolute: 0.1 10*3/uL (ref 0.0–0.1)
Eosinophils Absolute: 0.1 10*3/uL (ref 0.0–0.5)
Eosinophils Relative: 2 %
HCT: 42.6 % (ref 39.0–52.0)
Hemoglobin: 14.2 g/dL (ref 13.0–17.0)
Immature Granulocytes: 3 %
LYMPHS PCT: 33 %
Lymphs Abs: 1.3 10*3/uL (ref 0.7–4.0)
MCH: 29.3 pg (ref 26.0–34.0)
MCHC: 33.3 g/dL (ref 30.0–36.0)
MCV: 87.8 fL (ref 80.0–100.0)
Monocytes Absolute: 0.7 10*3/uL (ref 0.1–1.0)
Monocytes Relative: 17 %
Neutro Abs: 1.7 10*3/uL (ref 1.7–7.7)
Neutrophils Relative %: 44 %
PLATELETS: 212 10*3/uL (ref 150–400)
RBC: 4.85 MIL/uL (ref 4.22–5.81)
RDW: 13.3 % (ref 11.5–15.5)
WBC: 3.9 10*3/uL — AB (ref 4.0–10.5)
nRBC: 0 % (ref 0.0–0.2)

## 2018-01-09 LAB — TSH: TSH: 1.558 u[IU]/mL (ref 0.350–4.500)

## 2018-01-09 LAB — LACTATE DEHYDROGENASE: LDH: 172 U/L (ref 98–192)

## 2018-01-09 MED ORDER — HEPARIN SOD (PORK) LOCK FLUSH 100 UNIT/ML IV SOLN
500.0000 [IU] | Freq: Once | INTRAVENOUS | Status: AC
  Administered 2018-01-09: 500 [IU]
  Filled 2018-01-09: qty 5

## 2018-01-09 MED ORDER — SODIUM CHLORIDE 0.9% FLUSH
10.0000 mL | Freq: Once | INTRAVENOUS | Status: AC
  Administered 2018-01-09: 10 mL
  Filled 2018-01-09: qty 10

## 2018-01-09 NOTE — Patient Instructions (Signed)
Thank you for choosing [facility] to provide your oncology and hematology care.  To afford each patient quality time with our providers, please arrive 30 minutes before your scheduled appointment time.  If you arrive late for your appointment, you may be asked to reschedule.  We strive to give you quality time with our providers, and arriving late affects you and other patients whose appointments are after yours.    If you are a no show for multiple scheduled visits, you may be dismissed from the clinic at the providers discretion.     Again, thank you for choosing [facility], our hope is that these requests will decrease the amount of time that you wait before being seen by our physicians.  ______________________________________________________________________   Should you have questions after your visit to the [facility], please contact our office at [phone] between the hours of 8:30 and 4:30 p.m.    Voicemails left after 4:30p.m will not be returned until the following business day.     For prescription refill requests, please have your pharmacy contact us directly.  Please also try to allow 48 hours for prescription requests.     Please contact the scheduling department for questions regarding scheduling.  For scheduling of procedures such as PET scans, CT scans, MRI, Ultrasound, etc please contact central scheduling at [phone].     Resources For Cancer Patients and Caregivers:    Oncolink.org:  A wonderful resource for patients and healthcare providers for information regarding your disease, ways to tract your treatment, what to expect, etc.      American Cancer Society:  800-227-2345  Can help patients locate various types of support and financial assistance   Cancer Care: 1-800-813-HOPE (4673) Provides financial assistance, online support groups, medication/co-pay assistance.     [place] DSS:  336-641-3447 Where to apply  for food stamps, Medicaid, and utility assistance   Medicare Rights Center: 800-333-4114 Helps people with Medicare understand their rights and benefits, navigate the Medicare system, and secure the quality healthcare they deserve   SCAT: 336-333-6589 [place] [employer]'s shared-ride transportation service for eligible riders who have a disability that prevents them from riding the fixed route bus.     For additional information on assistance programs please contact our social worker:   Abigail Elmore:  [phone]  

## 2018-01-09 NOTE — Progress Notes (Signed)
HEMATOLOGY/ONCOLOGY CLINIC NOTE  Date of Service: 01/09/2018  Patient Care Team: Orpah Melter, MD as PCP - General (Family Medicine)  CHIEF COMPLAINTS/PURPOSE OF CONSULTATION:  recently diagnosed Diffuse Large B-Cell Lymphoma   HISTORY OF PRESENTING ILLNESS:   Alex Mendoza is a wonderful 47 y.o. male who has been referred to [place] by Alesia Morin, NP for evaluation and management of Diffuse Large B-Cell Lymphoma. He is accompanied today by his wife. The pt reports that he is doing well overall.   The pt presented to care with his PCP Alesia Morin on 10/29/17 complaining of a swollen left armpit and a lump on left pectoral area that had not changed in 12 months. He first noted the left armpit 3 weeks ago, which he notes is growing quickly.   The pt reports that he occasionally gets headaches that present with some numbness and other feelings that he notes are difficult to describe. He notes that these headaches have worsened over the last 6 months and have caused him to miss work. He notes that these headaches seem to concentrate in the center of his forehead and he has needed to use up to 4-6 aspirin. He notes that his headaches have been evaluated in January 2018 with a MRI Brain which was normal.   The pt notes that he did have a deer tick bite in July 2017 and received prophylactic antibiotics.   The pt notes that he stays very active and eats healthy in general. He notes that he has anxiety and tries to mitigate his anxiety with cardio and staying active. He also recently began Buspirone and notes that he has been sleeping better in the last week.   Of note prior to the patient's visit today, pt has had Left breast nodule and Left axillary biopsies completed on 10/31/17 with results revealing Diffuse Large B-Cell Lymphoma.   Most recent lab results (10/29/17) of CBC is as follows: all values are WNL except for ANC at 8.5k.   On review of systems, pt reports staying active,  worsened headaches over 6 months, good energy levels, some anxiety, and denies skin rashes, drenching night sweats, fevers, chills, unexpected weight loss, abdominal pains, SOB, chest discomfort, leg swelling, joint pain and swelling, and any other symptoms.   On PMHx the pt reports borderline hypertension, hyperlipidemia, occasional somnambulism, choleycystectomy in 2007, left knee surgery. He denies sleep apnea.  On Social Hx the pt denies any ETOH consumption and denies ever smoking.  On Family Hx the pt reports paternal aunt with brain tumor, and denies blood or cancer disorders or CAD.  He reports sulfa antibiotic allergies with whole body rash.  Interval History:   Alex Mendoza returns today for management, evaluation, and prior to C3 R-CHOP treatment of his Diffuse Large B-Cell Lymphoma. The patient's last visit with [place] was on 12/24/17. He is accompanied today by his wife. The pt reports that he is doing well overall.   The pt reports that he has been tolerating chemotherapy very well overall. He notices that his previously enlarged masses have continued to resolve in size. The pt notes that he feels that his resting heart rate has been higher than normal, into the upper 60s, but denies any chest pain on exertion or otherwise and also denies any recent palpitations. The pt notes that he has been taking 54m Quinopril every other day, as opposed to 148mevery day, and has been checking his BP at night as opposed to the morning.  The  pt notes that he has continued to eat well and has been remaining very active with 5 mile hikes on the weekends with his son.  The pt has not had any nausea, vomiting, diarrhea, fevers, chills or night sweats. He has had some mild constipation which has been addressed successfully by Senna.   Lab results today (01/09/18) of CBC w/diff and CMP is as follows: all values are WNL except for WBC at 3.9k, Abs immature granulocytes at 0.10k. 01/09/18 LDH is  pending 01/09/18 Thyroglobulin is pending 01/09/18 Thyroglobulin Level is pending 01/09/18 TSH is pending  On review of systems, pt reports good energy levels, staying very active, mild constipation, and denies mouth sores, chest pain, palpitations, abdominal pains, nausea, vomiting, diarrhea, difficulty urinating, and any other symptoms.    MEDICAL HISTORY:  Past Medical History:  Diagnosis Date  . Bradycardia 07/20/2015  . Hypercholesteremia   . Hypertension   . Lymphoma, diffuse ([place])   . Palpitations 07/20/2015    SURGICAL HISTORY: Past Surgical History:  Procedure Laterality Date  . DG GALL BLADDER    . IR IMAGING GUIDED PORT INSERTION  11/28/2017  . KNEE SURGERY Right   . SHOULDER SURGERY Left     SOCIAL HISTORY: Social History   Socioeconomic History  . Marital status: Married    Spouse name: Margaretha Sheffield  . Number of children: 2  . Years of education: 29  . Highest education level: Not on file  Occupational History    Comment: [employer]  Social Needs  . Financial resource strain: Not on file  . Food insecurity:    Worry: Not on file    Inability: Not on file  . Transportation needs:    Medical: Not on file    Non-medical: Not on file  Tobacco Use  . Smoking status: Never Smoker  . Smokeless tobacco: Never Used  Substance and Sexual Activity  . Alcohol use: No    Alcohol/week: 0.0 standard drinks  . Drug use: No  . Sexual activity: Not on file  Lifestyle  . Physical activity:    Days per week: Not on file    Minutes per session: Not on file  . Stress: Not on file  Relationships  . Social connections:    Talks on phone: Not on file    Gets together: Not on file    Attends religious service: Not on file    Active member of club or organization: Not on file    Attends meetings of clubs or organizations: Not on file    Relationship status: Not on file  . Intimate partner violence:    Fear of current or ex partner: Not on file    Emotionally abused: Not on file     Physically abused: Not on file    Forced sexual activity: Not on file  Other Topics Concern  . Not on file  Social History Narrative   Lives with wife, family   Caffeine- some chocolate    FAMILY HISTORY: Family History  Problem Relation Age of Onset  . Atrial fibrillation Mother   . Heart failure Maternal Grandmother   . Heart failure Paternal Grandmother   . Bradycardia Father   . Cancer Paternal Aunt     ALLERGIES:  is allergic to sulfa antibiotics.  MEDICATIONS:  Current Outpatient Medications  Medication Sig Dispense Refill  . lidocaine-prilocaine (EMLA) cream Apply to affected area once 30 g 3  . LORazepam (ATIVAN) 0.5 MG tablet Take 1 tablet (0.5 mg total) by  mouth every 6 (six) hours as needed (Nausea or vomiting). (Patient not taking: Reported on 12/24/2017) 30 tablet 0  . ondansetron (ZOFRAN) 8 MG tablet Take 1 tablet (8 mg total) by mouth 2 (two) times daily as needed for refractory nausea / vomiting. Start on day 3 after chemotherapy. 30 tablet 1  . predniSONE (DELTASONE) 20 MG tablet Take 3 tablets (60 mg total) by mouth daily. Take on days 1-5 of chemotherapy. 15 tablet 6  . prochlorperazine (COMPAZINE) 10 MG tablet Take 1 tablet (10 mg total) by mouth every 6 (six) hours as needed (Nausea or vomiting). 30 tablet 6  . quinapril (ACCUPRIL) 20 MG tablet Take 20 mg by mouth every other day.    . rosuvastatin (CRESTOR) 10 MG tablet Take 10 mg by mouth daily.    Marland Kitchen senna-docusate (SENNA S) 8.6-50 MG tablet Take 2 tablets by mouth at bedtime. 60 tablet 1   No current facility-administered medications for this visit.    Facility-Administered Medications Ordered in Other Visits  Medication Dose Route Frequency Provider Last Rate Last Dose  . gadopentetate dimeglumine (MAGNEVIST) injection 18 mL  18 mL Intravenous Once PRN Penumalli, Earlean Polka, MD        REVIEW OF SYSTEMS:    A 10+ POINT REVIEW OF SYSTEMS WAS OBTAINED including neurology, dermatology, psychiatry,  cardiac, respiratory, lymph, extremities, GI, GU, Musculoskeletal, constitutional, breasts, reproductive, HEENT.  All pertinent positives are noted in the HPI.  All others are negative.   PHYSICAL EXAMINATION: ECOG PERFORMANCE STATUS: 1 - Symptomatic but completely ambulatory  . Vitals:   01/09/18 0851  BP: (!) 118/91  Pulse: 98  Resp: 20  Temp: 97.8 F (36.6 C)  SpO2: 97%   Filed Weights   01/09/18 0851  Weight: 193 lb 14.4 oz (88 kg)   .Body mass index is 29.48 kg/m.  GENERAL:alert, in no acute distress and comfortable SKIN: no acute rashes, no significant lesions EYES: conjunctiva are pink and non-injected, sclera anicteric OROPHARYNX: MMM, no exudates, no oropharyngeal erythema or ulceration NECK: supple, no JVD LYMPH:  4-5cm LN in left axilla, 2-3cm LN in chest wall, no palpable lymphadenopathy in the cervical or inguinal regions LUNGS: clear to auscultation b/l with normal respiratory effort HEART: regular rate & rhythm ABDOMEN:  normoactive bowel sounds , non tender, not distended. No palpable hepatosplenomegaly.  Extremity: no pedal edema PSYCH: alert & oriented x 3 with fluent speech NEURO: no focal motor/sensory deficits   LABORATORY DATA:  I have reviewed the data as listed  . CBC Latest Ref Rng & Units 01/09/2018 12/24/2017 12/15/2017  WBC 4.0 - 10.5 K/uL 3.9(L) 6.6 2.7(L)  Hemoglobin 13.0 - 17.0 g/dL 14.2 15.1 14.2  Hematocrit 39.0 - 52.0 % 42.6 45.1 42.5  Platelets 150 - 400 K/uL 212 283 196   Component     Latest Ref Rng & Units 11/14/2017  Sodium     135 - 145 mmol/L 142  Potassium     3.5 - 5.1 mmol/L 4.2  Chloride     98 - 111 mmol/L 108  CO2     22 - 32 mmol/L 24  Glucose     70 - 99 mg/dL 94  BUN     6 - 20 mg/dL 19  Creatinine     0.61 - 1.24 mg/dL 1.11  Calcium     8.9 - 10.3 mg/dL 9.4  Total Protein     6.5 - 8.1 g/dL 7.1  Albumin     3.5 - 5.0  g/dL 4.1  AST     15 - 41 U/L 51 (H)  ALT     0 - 44 U/L 50 (H)  Alkaline  Phosphatase     38 - 126 U/L 57  Total Bilirubin     0.3 - 1.2 mg/dL 0.7  GFR, Est Non African American     >60 mL/min >60  GFR, Est African American     >60 mL/min >60  Anion gap     5 - 15 10  LDH     98 - 192 U/L 219 (H)  HCV Ab     0.0 - 0.9 s/co ratio 0.1  Hep B Core Ab, Tot     Negative Negative  Hepatitis B Surface Ag     Negative Negative  HIV Screen 4th Generation wRfx     Non Reactive Non Reactive   12/08/17 Biopsy:       10/31/17 Left Breast and Left Axilla Biopsies:   11/17/2017 Genetic Testing:    RADIOGRAPHIC STUDIES: I have personally reviewed the radiological images as listed and agreed with the findings in the report. No results found.  ASSESSMENT & PLAN:   47 y.o. male with  1. Recently diagnosed Diffuse Large B-Cell Lymphoma Presenting with left breast lesion and left axillary Lnadenopathy  10/31/17 Left breast and left axilla LN pathologies which revealed Diffuse Large B-Cell Lymphoma with germinal center subtype and Ki67 of 40%   11/25/17 PET/CT revealed Hypermetabolic left axillary adenopathy and outer left breast mass, compatible with biopsy-proven lymphoma. 2. Hypermetabolic bilateral thyroid lobe nodules and hypermetabolic left levels 4 and 7 neck lymph nodes. These findings could all be due to involvement by lymphoma, although the possibility of a second primary malignancy of the thyroid cannot be excluded. 3. No additional hypermetabolic sites of disease. Normal size and activity of the spleen. 4. Right adrenal adenoma.   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  11/21/17 Brain MRI which revealed Normal examination.  No evidence of malignant involvement.  11/21/17 ECHO revealed a LV EF of 55%-60%   PLAN: -To  aid with mucositis, patient was advised to use 0.5 liter of water, one teaspoon of salt and one teaspoon of baking soda gargled several time a day.  -Discussed the recommendation to avoid crowds and avoid individuals with infections while receiving chemotherapy -Pt does not meet high-risk criteria for CNS involvement risk and do not recommend prophylactic intrathecal methotrexate. Not a double hit lymphoma -Patient's first and second nadir were not overtly concerning, and neutrophils successfully bounced back, therefore will continue to monitor pt throughout treatment for indication of G-CSF support -Discussed plan to treat pt with 6 cycles for patient's Stage II disease, with referral for discussion with Rad Onc afterwards -Begin Senna S two pills at night for one week, then one pill at night for constipation, recommended stopping Senna S if too frequent bowel movements develop  -Discussed pt labwork today, 01/09/18; blood counts and chemistries are stable, no neutropenia  -Thyroid functions and thyroglobulins are pending -Proceed with PET/CT as scheduled for 01/21/18  -The pt has no prohibitive toxicities from continuing C3 R-CHOP at this time. -Recommend that the pt speak with his PCP regarding his Quinapril dosage and noted that he should check his  blood pressure in the mornings, and take his medications as ordered by his PCP   2. Metastatic Papillary Metastatic Thyroid Carcinoma   12/08/17 biopsy results revealed metastatic papillary thyroid carcinoma   -Discussed with the patient regarding various treatment methods to aid with treating lymphoma and metastatic papillary thyroid carcinoma.  -Discussed with the patient and his wife regarding local lymph node dissection, surgical resection of thyroid, and potential radioactive iodine ablation needs for PTC. -ENT referral to Dr Lucia Gaskins for further evaluation of newly diagnosed papillary thyroid cancer done- appreciate input. -Endocrine referral to Dr  Chalmers Cater -will defer treatment of PTC until after completion of DLBCL treatment. -will track changes in [facility] on PET as well.   No orders of the defined types were placed in this encounter.   -F/u for C3 of R-CHOP as scheduled on 01/12/2018 -PET/CT as scheduled on 01/21/2018 -RTC for C4 of R-CHOP as scheduled with labs and MD visit (Dr Irene Limbo) on 02/02/2018   All of the patients questions were answered with apparent satisfaction. The patient knows to call the clinic with any problems, questions or concerns.  The total time spent in the appt was 35 minutes and more than 50% was on counseling and direct patient cares.    Sullivan Lone MD MS AAHIVMS [facility] Hematology/Oncology Physician [facility]  (Office):       [phone] (Work cell):  [phone] (Fax):           [phone]  01/09/2018 9:33 AM  I, Baldwin Jamaica, am acting as a scribe for Dr. Irene Limbo  .I have reviewed the above documentation for accuracy and completeness, and I agree with the above. Brunetta Genera MD

## 2018-01-10 LAB — THYROGLOBULIN ANTIBODY

## 2018-01-12 ENCOUNTER — Ambulatory Visit (HOSPITAL_BASED_OUTPATIENT_CLINIC_OR_DEPARTMENT_OTHER): Payer: 59 | Admitting: Medical

## 2018-01-12 ENCOUNTER — Inpatient Hospital Stay: Payer: 59

## 2018-01-12 ENCOUNTER — Ambulatory Visit: Payer: 59 | Admitting: Hematology

## 2018-01-12 ENCOUNTER — Encounter: Payer: Self-pay | Admitting: Hematology

## 2018-01-12 ENCOUNTER — Telehealth: Payer: Self-pay | Admitting: *Deleted

## 2018-01-12 ENCOUNTER — Other Ambulatory Visit: Payer: 59

## 2018-01-12 VITALS — BP 124/88 | HR 67 | Temp 99.2°F | Resp 18

## 2018-01-12 DIAGNOSIS — Z7189 Other specified counseling: Secondary | ICD-10-CM

## 2018-01-12 DIAGNOSIS — C8338 Diffuse large B-cell lymphoma, lymph nodes of multiple sites: Secondary | ICD-10-CM

## 2018-01-12 MED ORDER — SODIUM CHLORIDE 0.9 % IV SOLN
Freq: Once | INTRAVENOUS | Status: AC
  Administered 2018-01-12: 14:00:00 via INTRAVENOUS
  Filled 2018-01-12: qty 5

## 2018-01-12 MED ORDER — ACETAMINOPHEN 325 MG PO TABS
650.0000 mg | ORAL_TABLET | Freq: Once | ORAL | Status: AC
  Administered 2018-01-12: 650 mg via ORAL

## 2018-01-12 MED ORDER — HEPARIN SOD (PORK) LOCK FLUSH 100 UNIT/ML IV SOLN
500.0000 [IU] | Freq: Once | INTRAVENOUS | Status: AC | PRN
  Administered 2018-01-12: 500 [IU]
  Filled 2018-01-12: qty 5

## 2018-01-12 MED ORDER — SODIUM CHLORIDE 0.9 % IV SOLN
750.0000 mg/m2 | Freq: Once | INTRAVENOUS | Status: AC
  Administered 2018-01-12: 1520 mg via INTRAVENOUS
  Filled 2018-01-12: qty 76

## 2018-01-12 MED ORDER — ACETAMINOPHEN 325 MG PO TABS
ORAL_TABLET | ORAL | Status: AC
  Filled 2018-01-12: qty 2

## 2018-01-12 MED ORDER — DIPHENHYDRAMINE HCL 25 MG PO CAPS
50.0000 mg | ORAL_CAPSULE | Freq: Once | ORAL | Status: AC
  Administered 2018-01-12: 50 mg via ORAL

## 2018-01-12 MED ORDER — SODIUM CHLORIDE 0.9 % IV SOLN
Freq: Once | INTRAVENOUS | Status: AC
  Administered 2018-01-12: 15:00:00 via INTRAVENOUS
  Filled 2018-01-12: qty 250

## 2018-01-12 MED ORDER — SODIUM CHLORIDE 0.9% FLUSH
10.0000 mL | INTRAVENOUS | Status: DC | PRN
  Administered 2018-01-12: 10 mL
  Filled 2018-01-12: qty 10

## 2018-01-12 MED ORDER — PALONOSETRON HCL INJECTION 0.25 MG/5ML
0.2500 mg | Freq: Once | INTRAVENOUS | Status: AC
  Administered 2018-01-12: 0.25 mg via INTRAVENOUS

## 2018-01-12 MED ORDER — PALONOSETRON HCL INJECTION 0.25 MG/5ML
INTRAVENOUS | Status: AC
  Filled 2018-01-12: qty 5

## 2018-01-12 MED ORDER — DIPHENHYDRAMINE HCL 25 MG PO CAPS
ORAL_CAPSULE | ORAL | Status: AC
  Filled 2018-01-12: qty 2

## 2018-01-12 MED ORDER — VINCRISTINE SULFATE CHEMO INJECTION 1 MG/ML
2.0000 mg | Freq: Once | INTRAVENOUS | Status: AC
  Administered 2018-01-12: 2 mg via INTRAVENOUS
  Filled 2018-01-12: qty 2

## 2018-01-12 MED ORDER — DOXORUBICIN HCL CHEMO IV INJECTION 2 MG/ML
50.0000 mg/m2 | Freq: Once | INTRAVENOUS | Status: AC
  Administered 2018-01-12: 102 mg via INTRAVENOUS
  Filled 2018-01-12: qty 51

## 2018-01-12 MED ORDER — SODIUM CHLORIDE 0.9 % IV SOLN
375.0000 mg/m2 | Freq: Once | INTRAVENOUS | Status: AC
  Administered 2018-01-12: 800 mg via INTRAVENOUS
  Filled 2018-01-12: qty 50

## 2018-01-12 NOTE — Patient Instructions (Signed)
[facility] Discharge Instructions for Patients Receiving Chemotherapy  Today you received the following chemotherapy agents Adriamycin, Vincristine, Cytoxan, Rituxan.  To help prevent nausea and vomiting after your treatment, we encourage you to take your nausea medication as directed.  If you develop nausea and vomiting that is not controlled by your nausea medication, call the clinic.   BELOW ARE SYMPTOMS THAT SHOULD BE REPORTED IMMEDIATELY:  *FEVER GREATER THAN 100.5 F  *CHILLS WITH OR WITHOUT FEVER  NAUSEA AND VOMITING THAT IS NOT CONTROLLED WITH YOUR NAUSEA MEDICATION  *UNUSUAL SHORTNESS OF BREATH  *UNUSUAL BRUISING OR BLEEDING  TENDERNESS IN MOUTH AND THROAT WITH OR WITHOUT PRESENCE OF ULCERS  *URINARY PROBLEMS  *BOWEL PROBLEMS  UNUSUAL RASH Items with * indicate a potential emergency and should be followed up as soon as possible.  Feel free to call the clinic should you have any questions or concerns. The clinic phone number is [phone].  Please show the CHEMO ALERT CARD at check-in to the Emergency Department and triage nurse.   

## 2018-01-12 NOTE — Telephone Encounter (Signed)
Patient left VM asking why labs cancelled for this morning. Noted that he saw the change in MyChart on Friday. Advised pt that labs drawn on Friday were acceptable for  Today treatment. Patient verbalized understanding. Patient left MyChart message and second VM stating an area over his port is red and slightly tender following treatment Friday. Contacted [facility] PA, Sandi Mealy who states he will see patient in infusion prior to port access today. Per Mr. Tanner, patient can use EMLA creme as ordered. Patient informed of this plan and verbalized understanding.

## 2018-01-14 ENCOUNTER — Ambulatory Visit: Payer: 59

## 2018-01-14 ENCOUNTER — Ambulatory Visit: Payer: 59 | Admitting: Hematology

## 2018-01-14 ENCOUNTER — Other Ambulatory Visit: Payer: 59

## 2018-01-15 NOTE — Progress Notes (Signed)
The patient presented for a blood draw today prior to chemotherapy.  He noted that he was having tenderness and mild erythema above his port this morning.  I was asked to evaluate his port.  There was a slight area of redness immediately superior to the incision site in the right chest wall.  The area was tender to touch.  There was no increased warmth, exudate, or significant bruising.  The patient was reassured that his port appeared normal.  He was instructed to return as needed.  Sandi Mealy, MHS, PA-C Physician Assistant

## 2018-01-17 LAB — THYROGLOBULIN LEVEL: Thyroglobulin: 43 ng/mL — ABNORMAL HIGH

## 2018-01-21 ENCOUNTER — Ambulatory Visit (HOSPITAL_COMMUNITY)
Admission: RE | Admit: 2018-01-21 | Discharge: 2018-01-21 | Disposition: A | Discharge status: Home / Self Care | Payer: 59 | Source: Ambulatory Visit | Attending: Hematology | Admitting: Hematology

## 2018-01-21 DIAGNOSIS — C8338 Diffuse large B-cell lymphoma, lymph nodes of multiple sites: Secondary | ICD-10-CM | POA: Diagnosis present

## 2018-01-21 DIAGNOSIS — C8330 Diffuse large B-cell lymphoma, unspecified site: Secondary | ICD-10-CM | POA: Diagnosis not present

## 2018-01-21 DIAGNOSIS — D3501 Benign neoplasm of right adrenal gland: Secondary | ICD-10-CM | POA: Diagnosis not present

## 2018-01-21 LAB — GLUCOSE, CAPILLARY: Glucose-Capillary: 92 mg/dL (ref 70–99)

## 2018-01-21 MED ORDER — FLUDEOXYGLUCOSE F - 18 (FDG) INJECTION
9.6200 | Freq: Once | INTRAVENOUS | Status: AC | PRN
  Administered 2018-01-21: 9.62 via INTRAVENOUS

## 2018-01-22 ENCOUNTER — Encounter: Payer: Self-pay | Admitting: Hematology

## 2018-01-27 ENCOUNTER — Telehealth: Payer: Self-pay | Admitting: *Deleted

## 2018-01-27 NOTE — Telephone Encounter (Signed)
Patient called to cancel appointments next week at  [facility] with lab and MD and for infusion. States he is transferring his care to Dr. Lanier Ensign at [facility]. Following completion of treatment, states he may return to Dr. Irene Limbo for monitoring of condition.

## 2018-01-28 ENCOUNTER — Encounter: Payer: Self-pay | Admitting: Hematology

## 2018-01-28 DIAGNOSIS — C8330 Diffuse large B-cell lymphoma, unspecified site: Secondary | ICD-10-CM | POA: Diagnosis not present

## 2018-01-29 ENCOUNTER — Other Ambulatory Visit (HOSPITAL_BASED_OUTPATIENT_CLINIC_OR_DEPARTMENT_OTHER): Payer: Self-pay | Admitting: Hematology

## 2018-01-29 DIAGNOSIS — Z8249 Family history of ischemic heart disease and other diseases of the circulatory system: Secondary | ICD-10-CM

## 2018-02-02 ENCOUNTER — Ambulatory Visit: Payer: 59

## 2018-02-02 ENCOUNTER — Ambulatory Visit: Payer: 59 | Admitting: Hematology

## 2018-02-02 ENCOUNTER — Other Ambulatory Visit: Payer: 59

## 2018-02-02 DIAGNOSIS — C8330 Diffuse large B-cell lymphoma, unspecified site: Secondary | ICD-10-CM | POA: Diagnosis not present

## 2018-02-02 DIAGNOSIS — Z5112 Encounter for antineoplastic immunotherapy: Secondary | ICD-10-CM | POA: Diagnosis not present

## 2018-02-02 DIAGNOSIS — Z5111 Encounter for antineoplastic chemotherapy: Secondary | ICD-10-CM | POA: Diagnosis not present

## 2018-02-04 ENCOUNTER — Ambulatory Visit (HOSPITAL_BASED_OUTPATIENT_CLINIC_OR_DEPARTMENT_OTHER)
Admission: RE | Admit: 2018-02-04 | Discharge: 2018-02-04 | Disposition: A | Discharge status: Home / Self Care | Payer: 59 | Source: Ambulatory Visit | Attending: Hematology | Admitting: Hematology

## 2018-02-04 DIAGNOSIS — I340 Nonrheumatic mitral (valve) insufficiency: Secondary | ICD-10-CM | POA: Diagnosis not present

## 2018-02-04 DIAGNOSIS — I119 Hypertensive heart disease without heart failure: Secondary | ICD-10-CM | POA: Diagnosis not present

## 2018-02-04 DIAGNOSIS — E785 Hyperlipidemia, unspecified: Secondary | ICD-10-CM | POA: Diagnosis not present

## 2018-02-04 DIAGNOSIS — Z8249 Family history of ischemic heart disease and other diseases of the circulatory system: Secondary | ICD-10-CM | POA: Insufficient documentation

## 2018-02-04 DIAGNOSIS — R002 Palpitations: Secondary | ICD-10-CM | POA: Diagnosis not present

## 2018-02-04 NOTE — Progress Notes (Signed)
  Echocardiogram 2D Echocardiogram has been performed.  Cardell Peach RDCS, RVT 02/04/2018, 4:02 PM

## 2018-02-12 ENCOUNTER — Telehealth: Payer: Self-pay | Admitting: Cardiovascular Disease

## 2018-02-12 NOTE — Progress Notes (Signed)
Cardiology Office Note   Date:  02/13/2018   ID:  Alex Mendoza, DOB [birth year], MRN [id number]  PCP:  Orpah Melter, MD Cardiologist:  Skeet Latch, MD 07/20/2015 Rosaria Ferries, PA-C   No chief complaint on file.   History of Present Illness: Alex Mendoza is a 47 y.o. male with a history of HTN, HLD, large B cell lymphoma diagnosed 10/2017, bradycardia, eval for palpitations 2017, episodes infrequent and so monitor not felt helpful, ETT normal with good exercise tolerance; echo as part of his lymphoma eval was normal  12/08/2017 biopsy revealed metastatic papillary thyroid carcinoma 12/5 phone note regarding elevated heart rates and throbbing pain in his shoulder, appointment made  [facility] presents for cardiology follow up.  He is going through chemo w/ R-CHOP for the lymphoma. He is to complete chemo for the lymphoma, then get a thyroidectomy.   He was prescribed Crestor, has not taken it yet. Oncologist recommended holding off till after chemo.   He monitors his BP and heart rate regularly. BP was running high, quinapril increased to 20 mg qd a few weeks ago. However, BP is still higher than normal, w/ DBP frequently 90 or better. This is new, since starting chemo. SBP is also higher than normal, 140 or so. Not over 150 except right after exercise. Once, after exercise, his DBP was 105.   Heart rate is higher than usual. His HR would be in the 40s at times, especially first thing in the morning. No sx. Now HR is generally > 70. His father noticed an increased HR>>cardiac eval>>stented the "widow maker".   He has noticed a throbbing in his shoulder. This is new. It's like he can feel his heart beat in his shoulder.  He is not able to sleep well when getting chemo, did not realize the steroids could do that.   He was exercising, running 9" miles x 5. However, now he is at approx 11" pace and can only do 3 miles.   He will need to go off all iodine after  chemo, to make RAI work.   He has gained weight on chemo. He still has an appetite except during chemo weeks. He watches his urine and will force himself to drink more water to stay hydrated. No orthostatic sx.  When he is exercising, his heart rate will routinely get to > 160.   Past Medical History:  Diagnosis Date  . Bradycardia 07/20/2015  . Hypercholesteremia   . Hypertension   . Lymphoma, diffuse ([place])   . Palpitations 07/20/2015    Past Surgical History:  Procedure Laterality Date  . DG GALL BLADDER    . IR IMAGING GUIDED PORT INSERTION  11/28/2017  . KNEE SURGERY Right   . SHOULDER SURGERY Left     Current Outpatient Medications  Medication Sig Dispense Refill  . lidocaine-prilocaine (EMLA) cream Apply to affected area once 30 g 3  . LORazepam (ATIVAN) 0.5 MG tablet Take 1 tablet (0.5 mg total) by mouth every 6 (six) hours as needed (Nausea or vomiting). 30 tablet 0  . ondansetron (ZOFRAN) 8 MG tablet Take 1 tablet (8 mg total) by mouth 2 (two) times daily as needed for refractory nausea / vomiting. Start on day 3 after chemotherapy. 30 tablet 1  . predniSONE (DELTASONE) 20 MG tablet Take 3 tablets (60 mg total) by mouth daily. Take on days 1-5 of chemotherapy. 15 tablet 6  . prochlorperazine (COMPAZINE) 10 MG tablet Take 1 tablet (10 mg total) by  mouth every 6 (six) hours as needed (Nausea or vomiting). 30 tablet 6  . quinapril (ACCUPRIL) 20 MG tablet Take 20 mg by mouth daily.     Marland Kitchen senna-docusate (SENNA S) 8.6-50 MG tablet Take 2 tablets by mouth at bedtime. 60 tablet 1   No current facility-administered medications for this visit.    Facility-Administered Medications Ordered in Other Visits  Medication Dose Route Frequency Provider Last Rate Last Dose  . gadopentetate dimeglumine (MAGNEVIST) injection 18 mL  18 mL Intravenous Once PRN Penumalli, Earlean Polka, MD        Allergies:   Sulfa antibiotics    Social History:  The patient  reports that he has never smoked. He  has never used smokeless tobacco. He reports that he does not drink alcohol or use drugs.   Family History:  The patient's family history includes Atrial fibrillation in his father and mother; Bradycardia in his father; Cancer in his paternal aunt; Heart disease (age of onset: 46) in his father; Heart failure in his maternal grandmother and paternal grandmother.  He indicated that his mother is alive. He indicated that his father is alive. He indicated that his sister is alive. He indicated that his brother is alive. He indicated that his maternal grandmother is deceased. He indicated that his paternal grandmother is deceased. He indicated that the status of his paternal aunt is unknown.  ROS:  Please see the history of present illness. All other systems are reviewed and negative.    PHYSICAL EXAM: VS:  BP 138/90   Pulse 84   Ht 5\' 8"  (1.727 m)   Wt 195 lb 12.8 oz (88.8 kg)   BMI 29.77 kg/m  , BMI Body mass index is 29.77 kg/m. GEN: Well nourished, well developed, male in no acute distress HEENT: normal for age  Neck: no JVD, no carotid bruit, no masses Cardiac: RRR; no murmur, no rubs, or gallops, no subclavian bruits Respiratory:  clear to auscultation bilaterally, normal work of breathing GI: soft, nontender, nondistended, + BS MS: no deformity or atrophy; no edema; distal pulses are 2+ in all 4 extremities  Skin: warm and dry, no rash Neuro:  Strength and sensation are intact Psych: euthymic mood, full affect   EKG:  EKG is ordered today. The ekg ordered today demonstrates SR, HR 84, no acute ischemic changes, no Q waves, normal intervals  ECHO: 02/04/2018 - Left ventricle: The cavity size was normal. There was mild   concentric hypertrophy. Systolic function was normal. The   estimated ejection fraction was in the range of 55% to 60%. Wall   motion was normal; there were no regional wall motion   abnormalities. Doppler parameters are consistent with abnormal   left  ventricular relaxation (grade 1 diastolic dysfunction). - Mitral valve: There was mild regurgitation.   Recent Labs: 01/09/2018: ALT 26; BUN 20; Creatinine 1.06; Hemoglobin 14.2; Platelet Count 212; Potassium 3.9; Sodium 143; TSH 1.558  CBC    Component Value Date/Time   WBC 3.9 (L) 01/09/2018 0807   WBC 6.6 12/24/2017 1020   RBC 4.85 01/09/2018 0807   HGB 14.2 01/09/2018 0807   HCT 42.6 01/09/2018 0807   PLT 212 01/09/2018 0807   MCV 87.8 01/09/2018 0807   MCH 29.3 01/09/2018 0807   MCHC 33.3 01/09/2018 0807   RDW 13.3 01/09/2018 0807   LYMPHSABS 1.3 01/09/2018 0807   MONOABS 0.7 01/09/2018 0807   EOSABS 0.1 01/09/2018 0807   BASOSABS 0.1 01/09/2018 0807   CMP Latest  Ref Rng & Units 01/09/2018 12/24/2017 12/15/2017  Glucose 70 - 99 mg/dL 93 88 96  BUN 6 - 20 mg/dL 20 22(H) 17  Creatinine 0.61 - 1.24 mg/dL 1.06 0.91 0.87  Sodium 135 - 145 mmol/L 143 141 140  Potassium 3.5 - 5.1 mmol/L 3.9 4.2 3.8  Chloride 98 - 111 mmol/L 109 106 106  CO2 22 - 32 mmol/L 23 26 26   Calcium 8.9 - 10.3 mg/dL 8.9 9.3 9.4  Total Protein 6.5 - 8.1 g/dL 6.7 7.1 7.2  Total Bilirubin 0.3 - 1.2 mg/dL 0.7 0.5 0.4  Alkaline Phos 38 - 126 U/L 61 59 62  AST 15 - 41 U/L 20 22 26   ALT 0 - 44 U/L 26 24 33     Lipid Panel No results found for: CHOL, HDL, LDLCALC, LDLDIRECT, TRIG, CHOLHDL    Wt Readings from Last 3 Encounters:  02/13/18 195 lb 12.8 oz (88.8 kg)  01/09/18 193 lb 14.4 oz (88 kg)  12/24/17 193 lb 3.2 oz (87.6 kg)     Other studies Reviewed: Additional studies/ records that were reviewed today include: office notes, hospital records and testing.  ASSESSMENT AND PLAN:  1.  Tachycardia:  - I explained that his body is under stress from the lymphoma, from the chemo. Steroids can also increase HR/BP - it is normal for his exercise tolerance to decrease when his body is under stress. - continue to follow HR, if HR and BP still elevated after chemo completed, consider BB/amlodipine or  increase ACE  2. HTN - BP is just at or above target most of the time - has hypertensive response to exercise.  - ok to hold off on med changes until after chemo, see if BP still high - could add BB or amlodipine   3. FH CAD - he is very concerned because his dad had an increase in resting HR just before significant CAD was found - had stent for "widow-maker" - do not feel treadmill or stress echo would provide additional information as he routinely exercises strenuously w/out sx. - however, if non-obs plaque were found, that would change goal LDL to 70 - we discussed a cardiac CT - however, there is some risk of renal damage from R-CHOP and he is (legitimately) concerned about lifetime radiation risk. - I explained that we use non-iodinated contrast, so a low-iodine diet is not a problem, his renal function is normal - f/u w/ Dr Oval Linsey in January, discuss then, call if he wishes to go ahead and get it done.   4. Lymphoma and thyroid CA - he will complete chemo for Lymphoma in January - then will go on No-iodine diet in preparation for RAI and thyroidectomy  Current medicines are reviewed at length with the patient today.  The patient does not have concerns regarding medicines.  The following changes have been made:  no change  Labs/ tests ordered today include:  No orders of the defined types were placed in this encounter.    Disposition:   FU with Skeet Latch, MD in January Cc: Sunny Schlein, MD (Attending Oncologist at [facility]) DM: [phone] (Work) [phone] (Fax) [place], [place] [address] Oncology  Signed, Rosaria Ferries, PA-C  02/13/2018 8:52 AM    [place] Phone: [phone]; Fax: [phone]

## 2018-02-12 NOTE — Telephone Encounter (Signed)
Spoke with pt who states over the last few days he has noticed an increase in his heart rate. Pt states his HR is normally 50-60's at rest but its been 20-30 beats higher than his norm. He reports on different occasion his BP machine showed his HR at 140 and 150 but states his machine could need to be calibrated. Pt states only symptoms he has had was throbbing pain in his shoulder. He states he doesn't know if its coming from his heart or muscle twitching. Pt denies symptoms at this time. 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   Appointment made for pt to see Rosaria Ferries, PA tomorrow 02/13/18 at 830 am. Pt instructed to report to ED if he notice his HR is running consistently high or become symptomatic. Pt verbalized understanding.  Called pt back and left a message for pt to bring BP machine to appointment.

## 2018-02-13 ENCOUNTER — Ambulatory Visit (INDEPENDENT_AMBULATORY_CARE_PROVIDER_SITE_OTHER): Payer: 59 | Admitting: Physician Assistant

## 2018-02-13 ENCOUNTER — Encounter: Payer: Self-pay | Admitting: Physician Assistant

## 2018-02-13 VITALS — BP 138/90 | HR 84 | Ht 68.0 in | Wt 195.8 lb

## 2018-02-13 DIAGNOSIS — R000 Tachycardia, unspecified: Secondary | ICD-10-CM | POA: Diagnosis not present

## 2018-02-13 DIAGNOSIS — I10 Essential (primary) hypertension: Secondary | ICD-10-CM

## 2018-02-13 DIAGNOSIS — C8338 Diffuse large B-cell lymphoma, lymph nodes of multiple sites: Secondary | ICD-10-CM | POA: Diagnosis not present

## 2018-02-13 DIAGNOSIS — Z8249 Family history of ischemic heart disease and other diseases of the circulatory system: Secondary | ICD-10-CM | POA: Diagnosis not present

## 2018-02-13 NOTE — Patient Instructions (Signed)
Medication Instructions:  NO CHANGES- Your physician recommends that you continue on your current medications as directed. Please refer to the Current Medication list given to you today. If you need a refill on your cardiac medications before your next appointment, please call your pharmacy.  Labwork: *If you have labs (blood work) drawn today and your tests are completely normal, you will receive your results only by: Marland Kitchen MyChart Message (if you have MyChart) OR . A paper copy in the mail If you have any lab test that is abnormal or we need to change your treatment, we will call you  Special Instructions: CALL us IF YOU DECIDE YOU WANT THE CT DONE MAKE SURE TO STAY HYDRATED AND INCLUDE A LITTLE BIT OF GATORAID CONTINUE TO FOLLOW BP AND HEART-RATE  Follow-Up: You will need a follow up appointment in [place].   You may see  DR Oliver Barre, PA-C or one of the following Advanced Practice Providers on your designated Care Team:  Kerin Ransom, [place]  At [facility], you and your health needs are our priority.  As part of our continuing mission to provide you with exceptional heart care, we have created designated Provider Care Teams.  These Care Teams include your primary Cardiologist (physician) and Advanced Practice Providers (APPs -  Physician Assistants and Nurse Practitioners) who all work together to provide you with the care you need, when you need it.

## 2018-02-18 ENCOUNTER — Encounter: Payer: Self-pay | Admitting: Hematology

## 2018-02-20 ENCOUNTER — Telehealth: Payer: Self-pay | Admitting: *Deleted

## 2018-02-20 NOTE — Telephone Encounter (Signed)
Patient stated he had spoken with the clinical team following him at [facility] yesterday regarding the redness and swelling. He was following their directions for treatment.

## 2018-02-23 DIAGNOSIS — Z5112 Encounter for antineoplastic immunotherapy: Secondary | ICD-10-CM | POA: Diagnosis not present

## 2018-02-23 DIAGNOSIS — C8330 Diffuse large B-cell lymphoma, unspecified site: Secondary | ICD-10-CM | POA: Diagnosis not present

## 2018-02-23 DIAGNOSIS — Z5111 Encounter for antineoplastic chemotherapy: Secondary | ICD-10-CM | POA: Diagnosis not present

## 2018-02-23 DIAGNOSIS — C8338 Diffuse large B-cell lymphoma, lymph nodes of multiple sites: Secondary | ICD-10-CM | POA: Diagnosis not present

## 2018-03-06 DIAGNOSIS — C73 Malignant neoplasm of thyroid gland: Secondary | ICD-10-CM | POA: Diagnosis not present

## 2018-03-18 DIAGNOSIS — C8330 Diffuse large B-cell lymphoma, unspecified site: Secondary | ICD-10-CM | POA: Diagnosis not present

## 2018-03-18 DIAGNOSIS — Z5111 Encounter for antineoplastic chemotherapy: Secondary | ICD-10-CM | POA: Diagnosis not present

## 2018-03-18 DIAGNOSIS — Z5112 Encounter for antineoplastic immunotherapy: Secondary | ICD-10-CM | POA: Diagnosis not present

## 2018-03-18 DIAGNOSIS — C8334 Diffuse large B-cell lymphoma, lymph nodes of axilla and upper limb: Secondary | ICD-10-CM | POA: Diagnosis not present

## 2018-04-06 ENCOUNTER — Ambulatory Visit: Payer: 59 | Admitting: Cardiovascular Disease

## 2018-04-08 DIAGNOSIS — E042 Nontoxic multinodular goiter: Secondary | ICD-10-CM | POA: Diagnosis not present

## 2018-04-08 DIAGNOSIS — C8330 Diffuse large B-cell lymphoma, unspecified site: Secondary | ICD-10-CM | POA: Diagnosis not present

## 2018-04-28 DIAGNOSIS — C73 Malignant neoplasm of thyroid gland: Secondary | ICD-10-CM | POA: Diagnosis not present

## 2018-04-28 DIAGNOSIS — Z8572 Personal history of non-Hodgkin lymphomas: Secondary | ICD-10-CM | POA: Diagnosis not present

## 2018-04-28 DIAGNOSIS — C770 Secondary and unspecified malignant neoplasm of lymph nodes of head, face and neck: Secondary | ICD-10-CM | POA: Diagnosis not present

## 2018-04-29 DIAGNOSIS — C73 Malignant neoplasm of thyroid gland: Secondary | ICD-10-CM | POA: Diagnosis not present

## 2018-05-05 DIAGNOSIS — E890 Postprocedural hypothyroidism: Secondary | ICD-10-CM | POA: Diagnosis not present

## 2018-05-05 DIAGNOSIS — Z85850 Personal history of malignant neoplasm of thyroid: Secondary | ICD-10-CM | POA: Diagnosis not present

## 2018-05-05 DIAGNOSIS — C73 Malignant neoplasm of thyroid gland: Secondary | ICD-10-CM | POA: Diagnosis not present

## 2018-05-08 DIAGNOSIS — C73 Malignant neoplasm of thyroid gland: Secondary | ICD-10-CM | POA: Diagnosis not present

## 2018-06-12 ENCOUNTER — Encounter: Payer: Self-pay | Admitting: *Deleted

## 2018-06-24 ENCOUNTER — Ambulatory Visit: Payer: 59 | Admitting: Cardiovascular Disease

## 2018-11-02 IMAGING — US US THYROID
1 series · 16 of 25 positions shown · non-contrast
Comparison: PET scan on 11/25/2017

CLINICAL DATA: Incidental on PET. Diffuse large B-cell lymphoma
proven by biopsy of left axillary lymph node and left breast/chest
wall mass. PET evaluation demonstrates bilateral hypermetabolic
thyroid nodules.

EXAM:
THYROID ULTRASOUND
TECHNIQUE: Ultrasound examination of the thyroid gland and adjacent soft
tissues was performed.

[Series 1: us thyroid · 0.06mm/px · 16 of 111 slices shown]
[im 1/111]
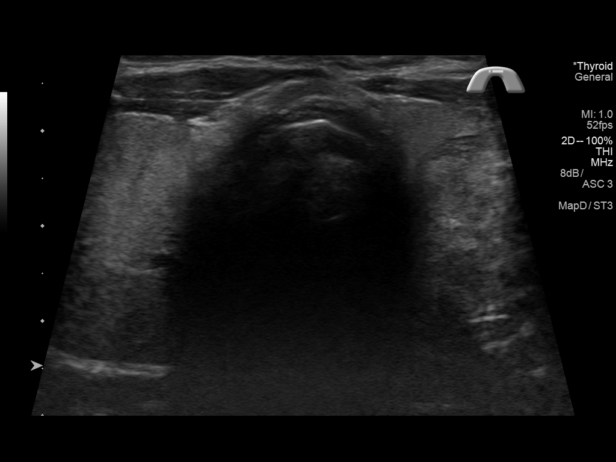
[im 10/111]
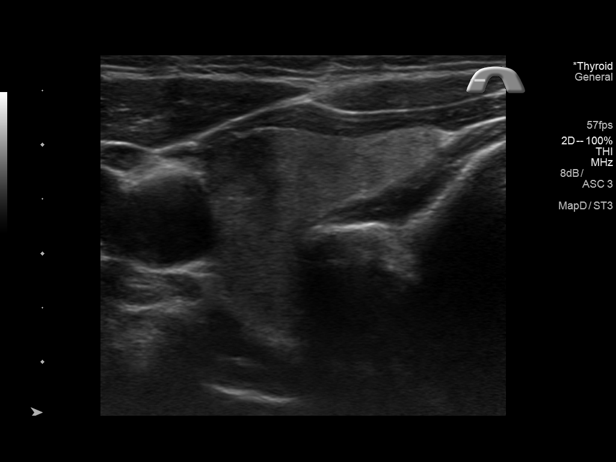
[im 14/111]
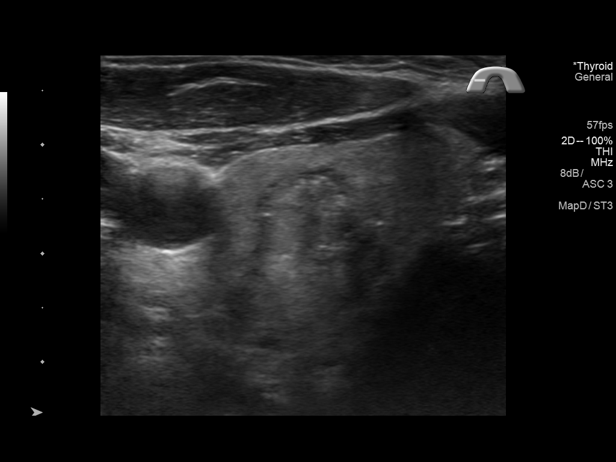
[im 23/111]
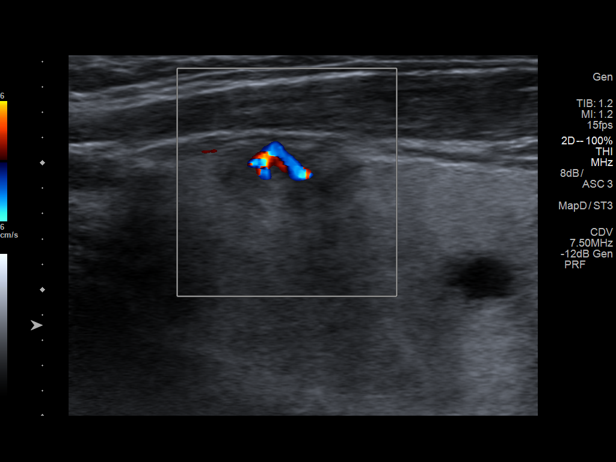
[im 33/111]
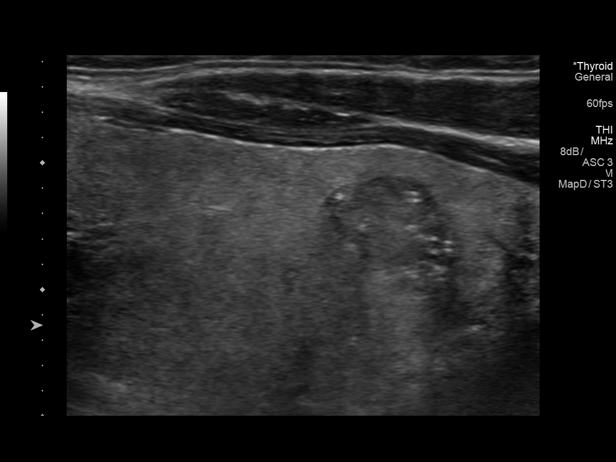
[im 37/111]
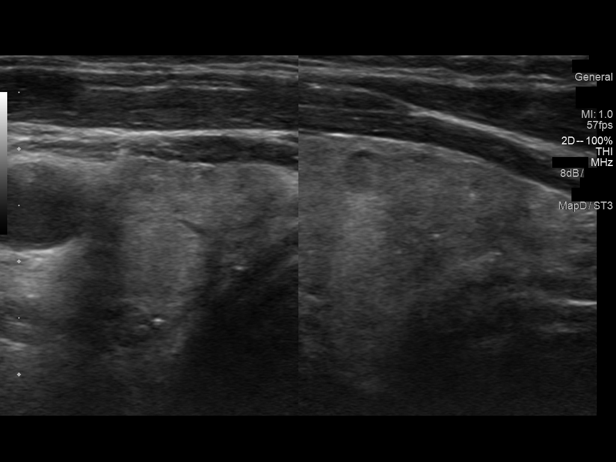
[im 46/111]
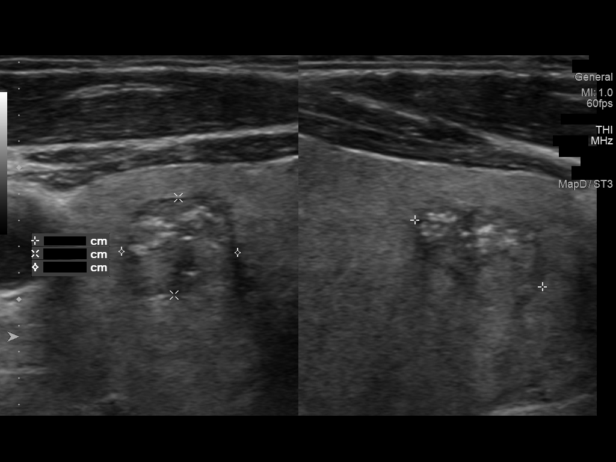
[im 51/111]
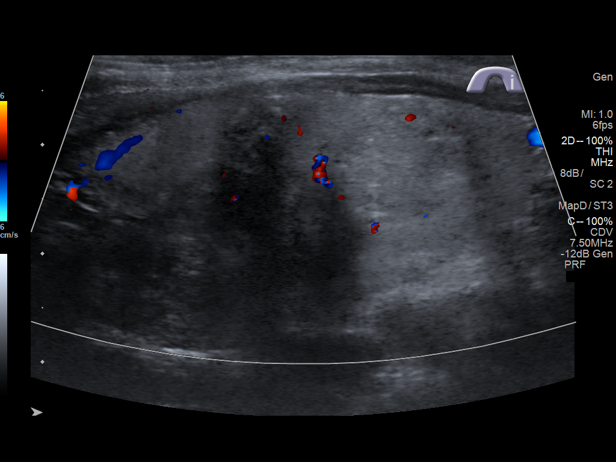
[im 60/111]
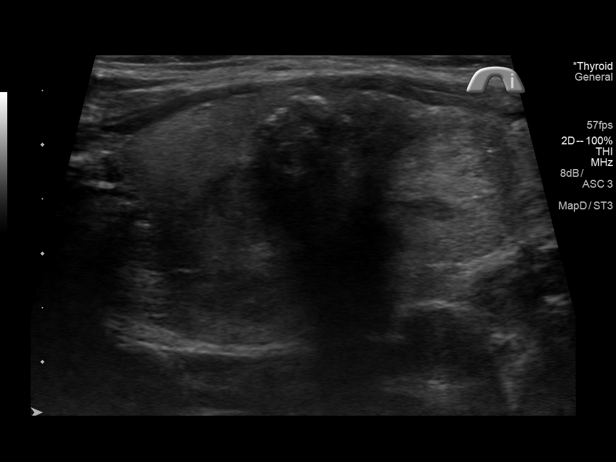
[im 65/111]
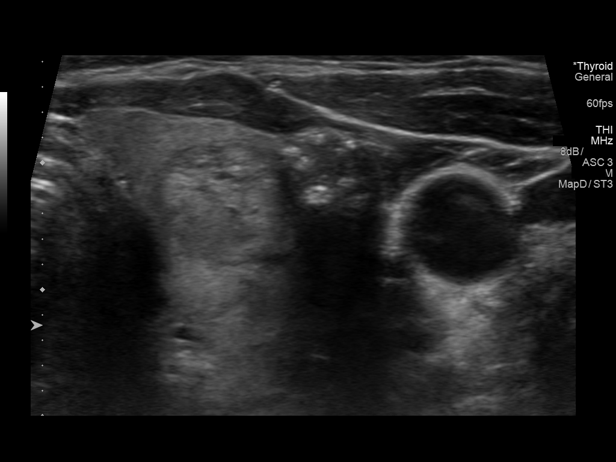
[im 74/111]
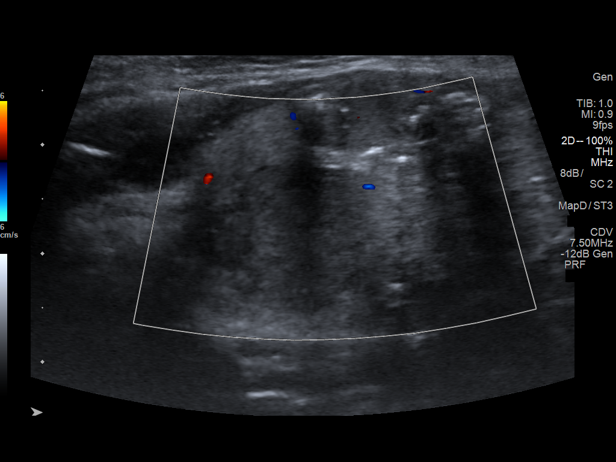
[im 78/111]
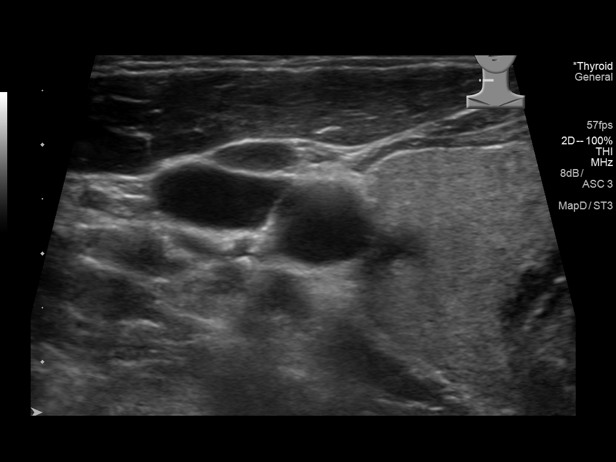
[im 88/111]
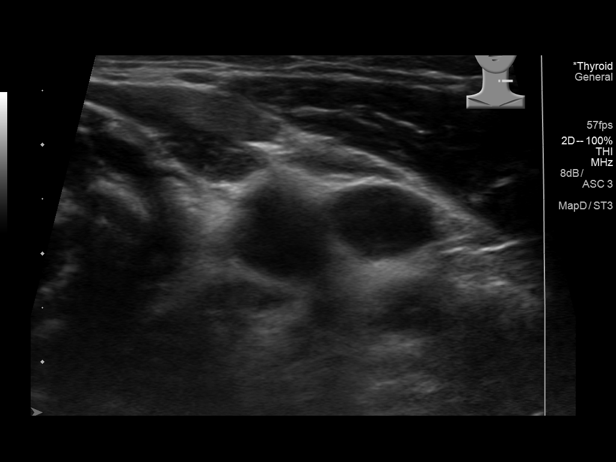
[im 97/111]
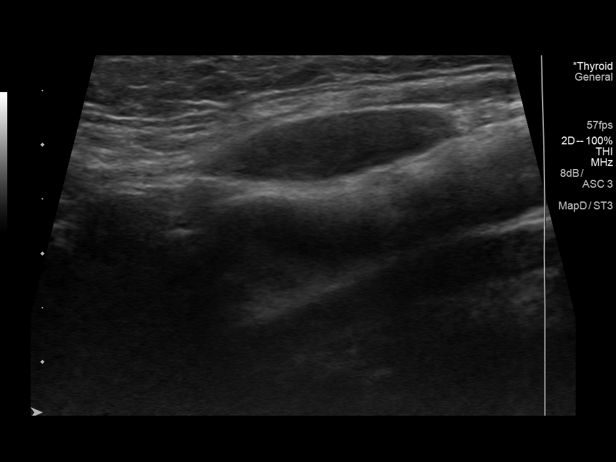
[im 101/111]
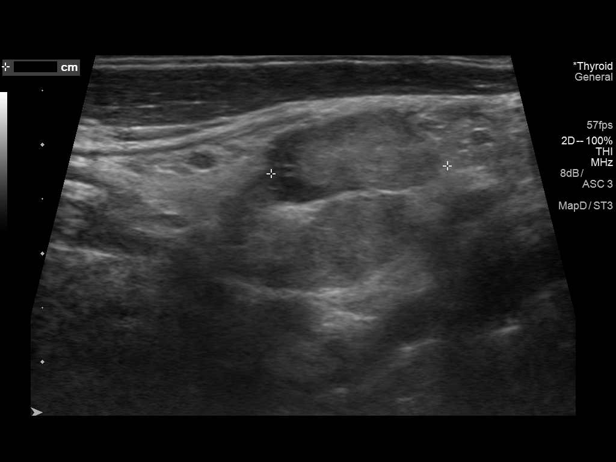
[im 111/111]
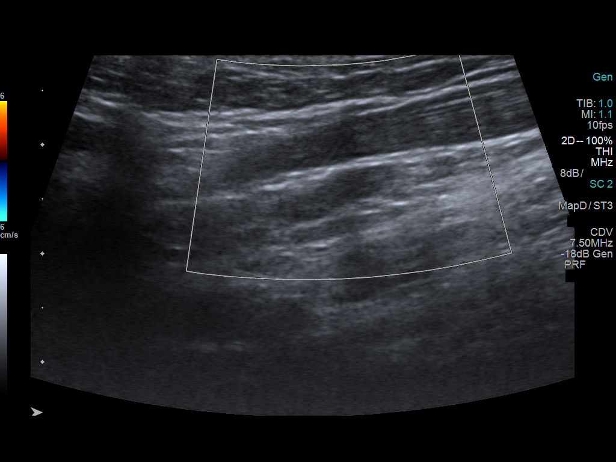

[16 of 25 positions shown; findings below may reference images not displayed]

FINDINGS: Parenchymal Echotexture: Mildly heterogenous

Isthmus: 0.2 cm

Right lobe: 5.0 x 2.8 x 2.2 cm

Left lobe: 4.7 x 2.3 x 2.7 cm

_________________________________________________________

Estimated total number of nodules >/= 1 cm: 6-10

Number of spongiform nodules >/=  2 cm not described below (TR1): 0

Number of mixed cystic and solid nodules >/= 1.5 cm not described
below (TR2): 0

_________________________________________________________

Multiple nodules are present. The 4 largest and most suspicious are
characterized formally. Additional comment on other nodules given
below.

Nodule # 1:

Location: Right; Inferior (Labeled right #3 on worksheet)

Maximum size: 1.1 cm; Other 2 dimensions: 0.7 x 0.9 cm

Composition: solid/almost completely solid (2)

Echogenicity: hypoechoic (2)

Shape: not taller-than-wide (0)

Margins: ill-defined (0)

Echogenic foci: punctate echogenic foci (3)

ACR TI-RADS total points: 7.

ACR TI-RADS risk category: TR5 (>/= 7 points).

ACR TI-RADS recommendations:

**Given size (>/= 1.0 cm) and appearance, fine needle aspiration of
this highly suspicious nodule should be considered based on TI-RADS
criteria. This nodule corresponds to the hypermetabolic PET positive
nodule on the right.

_________________________________________________________

Nodule # 2:

Location: Left; Mid (Labeled left #1 on worksheet)

Maximum size: 1.9 cm; Other 2 dimensions: 0.8 x 0.8 cm

Composition: solid/almost completely solid (2)

Echogenicity: hypoechoic (2)

Shape: not taller-than-wide (0)

Margins: ill-defined (0)

Echogenic foci: peripheral calcifications (2)

ACR TI-RADS total points: 6.

ACR TI-RADS risk category: TR4 (4-6 points).

ACR TI-RADS recommendations:

**Given size (>/= 1.5 cm) and appearance, fine needle aspiration of
this moderately suspicious nodule should be considered based on
TI-RADS criteria. This nodule corresponds to the hypermetabolic PET
positive nodule on the left.

_________________________________________________________

Nodule # 3:

Location: Left; Inferior (Left #2 on worksheet)

Maximum size: 1.5 cm; Other 2 dimensions: 1.0 x 1.1 cm

Composition: solid/almost completely solid (2)

Echogenicity: isoechoic (1)

Shape: not taller-than-wide (0)

Margins: smooth (0)

Echogenic foci: none (0)

ACR TI-RADS total points: 3.

ACR TI-RADS risk category: TR3 (3 points).

ACR TI-RADS recommendations:

*Given size (>/= 1.5 - 2.4 cm) and appearance, a follow-up
ultrasound in 1 year should be considered based on TI-RADS criteria.

_________________________________________________________

Nodule # 4:

Location: Left; Inferior (Left #3 on worksheet)

Maximum size: 1.5 cm; Other 2 dimensions: 1.3 x 1.3 cm

Composition: solid/almost completely solid (2)

Echogenicity: hypoechoic (2)

Shape: not taller-than-wide (0)

Margins: smooth (0)

Echogenic foci: none (0)

ACR TI-RADS total points: 4.

ACR TI-RADS risk category: TR4 (4-6 points).

ACR TI-RADS recommendations:

**Given size (>/= 1.5 cm) and appearance, fine needle aspiration of
this moderately suspicious nodule should be considered based on
TI-RADS criteria. This nodule is borderline for fine-needle
aspiration and was not PET positive.

_________________________________________________________

There are 2 additional partially cystic nodules in the right lobe
and a 1 cm solid nodule. These do not meet criteria for fine-needle
aspiration currently. Additional area measured up to 1.7 cm in the
right inferior lobe is very ill-defined and likely represents an
area of heterogeneity rather than a true discrete nodule.

Multiple scattered small lymph nodes are seen bilaterally in the
neck.
IMPRESSION: 1. 1.1 cm right inferior thyroid nodule meets criteria for
fine-needle aspiration and corresponds to the hypermetabolic
right-sided nodule by PET scan. This nodule contains punctate
echogenic foci.
2. 1.9 cm left mid thyroid nodule meets criteria for fine-needle
aspiration and corresponds to the hypermetabolic left-sided nodule
by PET scan. This nodule demonstrates prominent peripheral
calcification and some internal calcification.
3. Only recommend fine-needle aspiration currently of the 2 above
nodules. A 1 year follow-up ultrasound is recommended for 2
additional 1.5 cm left inferior thyroid nodules above.

The above is in keeping with the ACR TI-RADS recommendations - [HOSPITAL] 3801;[DATE].

## 2019-03-19 ENCOUNTER — Other Ambulatory Visit (HOSPITAL_BASED_OUTPATIENT_CLINIC_OR_DEPARTMENT_OTHER): Payer: Self-pay | Admitting: Surgical

## 2019-03-19 DIAGNOSIS — C73 Malignant neoplasm of thyroid gland: Secondary | ICD-10-CM

## 2019-03-20 ENCOUNTER — Other Ambulatory Visit: Payer: Self-pay

## 2019-03-20 ENCOUNTER — Ambulatory Visit (HOSPITAL_BASED_OUTPATIENT_CLINIC_OR_DEPARTMENT_OTHER)
Admission: RE | Admit: 2019-03-20 | Discharge: 2019-03-20 | Disposition: A | Discharge status: Home / Self Care | Payer: 59 | Source: Ambulatory Visit | Attending: Surgical | Admitting: Surgical

## 2019-03-20 DIAGNOSIS — C73 Malignant neoplasm of thyroid gland: Secondary | ICD-10-CM | POA: Insufficient documentation

## 2019-03-20 MED ORDER — GADOBUTROL 1 MMOL/ML IV SOLN
8.8000 mL | Freq: Once | INTRAVENOUS | Status: AC | PRN
  Administered 2019-03-20: 8.8 mL via INTRAVENOUS

## 2020-01-25 ENCOUNTER — Telehealth: Payer: Self-pay | Admitting: *Deleted

## 2020-01-25 NOTE — Telephone Encounter (Signed)
Called patient who stated he wanted to know what headache medicines had been prescribed for him. I advised Dr Leta Baptist didn't prescribe any but mentioned he may consider amitriptyline in future for headache prevention. patient stated his PCP prescribed it, thanked me for information. I advised he call for any future needs. Patient verbalized understanding, appreciation.

## 2020-01-25 NOTE — Telephone Encounter (Signed)
Pt called last appt 2018 has question about his medication. Please call [phone].

## 2020-12-29 ENCOUNTER — Telehealth: Payer: Self-pay | Admitting: *Deleted

## 2020-12-29 NOTE — Telephone Encounter (Signed)
Per referral - Dr. Doyle Askew called and lvm of upcoming appointments - mailed welcome packet with calendar

## 2021-01-02 ENCOUNTER — Encounter: Payer: Self-pay | Admitting: Hematology & Oncology

## 2021-01-09 ENCOUNTER — Inpatient Hospital Stay: Payer: 59

## 2021-01-09 ENCOUNTER — Other Ambulatory Visit: Payer: Self-pay

## 2021-01-09 ENCOUNTER — Inpatient Hospital Stay: Payer: 59 | Admitting: Hematology & Oncology

## 2021-01-12 ENCOUNTER — Inpatient Hospital Stay (HOSPITAL_BASED_OUTPATIENT_CLINIC_OR_DEPARTMENT_OTHER): Payer: 59 | Admitting: Hematology & Oncology

## 2021-01-12 ENCOUNTER — Other Ambulatory Visit: Payer: Self-pay

## 2021-01-12 ENCOUNTER — Encounter: Payer: Self-pay | Admitting: Hematology & Oncology

## 2021-01-12 ENCOUNTER — Inpatient Hospital Stay: Payer: 59 | Attending: Hematology & Oncology

## 2021-01-12 VITALS — BP 128/89 | HR 80 | Temp 98.3°F | Resp 20 | Ht 66.75 in | Wt 179.1 lb

## 2021-01-12 DIAGNOSIS — I10 Essential (primary) hypertension: Secondary | ICD-10-CM | POA: Diagnosis not present

## 2021-01-12 DIAGNOSIS — Z8572 Personal history of non-Hodgkin lymphomas: Secondary | ICD-10-CM

## 2021-01-12 DIAGNOSIS — C8338 Diffuse large B-cell lymphoma, lymph nodes of multiple sites: Secondary | ICD-10-CM

## 2021-01-12 DIAGNOSIS — Z85850 Personal history of malignant neoplasm of thyroid: Secondary | ICD-10-CM

## 2021-01-12 DIAGNOSIS — Z79899 Other long term (current) drug therapy: Secondary | ICD-10-CM | POA: Insufficient documentation

## 2021-01-12 DIAGNOSIS — Z85820 Personal history of malignant melanoma of skin: Secondary | ICD-10-CM | POA: Diagnosis not present

## 2021-01-12 DIAGNOSIS — Z9221 Personal history of antineoplastic chemotherapy: Secondary | ICD-10-CM | POA: Diagnosis not present

## 2021-01-12 LAB — CBC WITH DIFFERENTIAL (CANCER CENTER ONLY)
Abs Immature Granulocytes: 0.1 10*3/uL — ABNORMAL HIGH (ref 0.00–0.07)
Basophils Absolute: 0 10*3/uL (ref 0.0–0.1)
Basophils Relative: 0 %
Eosinophils Absolute: 0.1 10*3/uL (ref 0.0–0.5)
Eosinophils Relative: 2 %
HCT: 46.5 % (ref 39.0–52.0)
Hemoglobin: 15.3 g/dL (ref 13.0–17.0)
Immature Granulocytes: 1 %
Lymphocytes Relative: 25 %
Lymphs Abs: 1.8 10*3/uL (ref 0.7–4.0)
MCH: 29.1 pg (ref 26.0–34.0)
MCHC: 32.9 g/dL (ref 30.0–36.0)
MCV: 88.4 fL (ref 80.0–100.0)
Monocytes Absolute: 0.6 10*3/uL (ref 0.1–1.0)
Monocytes Relative: 9 %
Neutro Abs: 4.6 10*3/uL (ref 1.7–7.7)
Neutrophils Relative %: 63 %
Platelet Count: 262 10*3/uL (ref 150–400)
RBC: 5.26 MIL/uL (ref 4.22–5.81)
RDW: 12.6 % (ref 11.5–15.5)
WBC Count: 7.3 10*3/uL (ref 4.0–10.5)
nRBC: 0 % (ref 0.0–0.2)

## 2021-01-12 LAB — CMP (CANCER CENTER ONLY)
ALT: 35 U/L (ref 0–44)
AST: 28 U/L (ref 15–41)
Albumin: 4.3 g/dL (ref 3.5–5.0)
Alkaline Phosphatase: 68 U/L (ref 38–126)
Anion gap: 6 (ref 5–15)
BUN: 24 mg/dL — ABNORMAL HIGH (ref 6–20)
CO2: 32 mmol/L (ref 22–32)
Calcium: 9.9 mg/dL (ref 8.9–10.3)
Chloride: 102 mmol/L (ref 98–111)
Creatinine: 0.97 mg/dL (ref 0.61–1.24)
GFR, Estimated: >60 mL/min (ref >60)
Glucose, Bld: 92 mg/dL (ref 70–99)
Potassium: 4.2 mmol/L (ref 3.5–5.1)
Sodium: 140 mmol/L (ref 135–145)
Total Bilirubin: 0.5 mg/dL (ref 0.3–1.2)
Total Protein: 7.1 g/dL (ref 6.5–8.1)

## 2021-01-12 LAB — LACTATE DEHYDROGENASE: LDH: 169 U/L (ref 98–192)

## 2021-01-12 NOTE — Progress Notes (Signed)
Referral MD  Reason for Referral: Diffuse large cell non-Hodgkin's lymphoma Papillary thyroid cancer-lymph node metastasis  Chief Complaint  Patient presents with   New Patient (Initial Visit)    Transfer care from [facility].  : It is a lot easier to come here then to go to [facility] for my follow-up.  HPI: Mr. Alex Mendoza is a really nice 50 year old white male.  He is originally from [place].  We had a good time talking about [place].  He did go to [facility].  He works for one of [employer] in town.  He actually lives about 10 minutes away from the office.  Presented with large cell non-Hodgkin's lymphoma back in August 2019.  He had a left breast mass and left axillary mass.  Biopsy showed a diffuse large cell non-Hodgkin's lymphoma.  He was initially treated at the [facility].  He had a couple cycles of treatment with R-CHOP.  He then went to [facility].  He completed treatment out at [facility].  He has last PET scan that was done I think in January 2022.  This showed no evidence of recurrent lymphoma.  He also has a history of metastatic thyroid cancer.  This is papillary thyroid cancer.  He has had a thyroidectomy.  He had neck surgery.  I think he had lymph nodes that were positive.  He has had radioactive iodine treatment.  He is on Synthroid right now.  He is followed by endocrinology for this.  It is difficult for him to go to [facility] for follow-up.  He realized that we were so close to where he works and lives that he would like to follow-up with [place].  He is worried about a nodule which is suboccipital around the right side.  He has had this for a while.  I do not think it is ever shown up on a scan.  He has had no problems with fatigue or weakness.  He bike rides.  He seems to be quite athletic.  He has had no problems with bowels or bladder.  He has had no problems with fever, sweats or chills.  He has had no rashes.  He does have fair skin.  He does have hyperpigmented  lesions.  I think he does follow-up with dermatology.  There is been no problems with COVID.  He has had no mouth sores.  He has had no headache.  He does not smoke.  He has rare alcohol use.  Overall, his performance status is ECOG 0.    Past Medical History:  Diagnosis Date   Bradycardia 07/20/2015   Hypercholesteremia    Hypertension    Lymphoma, diffuse ([place])    Palpitations 07/20/2015  :   Past Surgical History:  Procedure Laterality Date   DG GALL BLADDER     IR IMAGING GUIDED PORT INSERTION  11/28/2017   KNEE SURGERY Right    SHOULDER SURGERY Left   :   Current Outpatient Medications:    amitriptyline (ELAVIL) 25 MG tablet, Take 25 mg by mouth at bedtime., Disp: , Rfl:    levothyroxine (SYNTHROID) 175 MCG tablet, Take 175 mcg by mouth daily., Disp: , Rfl:    quinapril (ACCUPRIL) 20 MG tablet, Take 20 mg by mouth daily. , Disp: , Rfl:    rosuvastatin (CRESTOR) 10 MG tablet, Take 10 mg by mouth daily., Disp: , Rfl:    senna-docusate (SENNA S) 8.6-50 MG tablet, Take 2 tablets by mouth at bedtime. (Patient not taking:  Reported on 01/12/2021), Disp: 60 tablet, Rfl: 1 No current facility-administered medications for this visit.  Facility-Administered Medications Ordered in Other Visits:    gadopentetate dimeglumine (MAGNEVIST) injection 18 mL, 18 mL, Intravenous, Once PRN, Penumalli, Vikram R, MD:  :   Allergies  Allergen Reactions   Sulfa Antibiotics Hives and Rash  :   Family History  Problem Relation Age of Onset   Atrial fibrillation Mother        s/p mini-Maze and ablation   Heart failure Maternal Grandmother    Heart failure Paternal Grandmother    Bradycardia Father    Heart disease Father 60   Atrial fibrillation Father        s/p ablation   Cancer Paternal Aunt   :   Social History   Socioeconomic History   Marital status: Married    Spouse name: Margaretha Sheffield   Number of children: 2   Years of education: 42   Highest education level: Not on file   Occupational History    Comment: [employer]  Tobacco Use   Smoking status: Never   Smokeless tobacco: Never  Vaping Use   Vaping Use: Never used  Substance and Sexual Activity   Alcohol use: No    Alcohol/week: 0.0 standard drinks   Drug use: No   Sexual activity: Not on file  Other Topics Concern   Not on file  Social History Narrative   Lives with wife, family   Caffeine- some chocolate   Social Determinants of Health   Financial Resource Strain: Not on file  Food Insecurity: Not on file  Transportation Needs: Not on file  Physical Activity: Not on file  Stress: Not on file  Social Connections: Not on file  Intimate Partner Violence: Not on file  :  Review of Systems  Constitutional: Negative.   HENT: Negative.    Eyes: Negative.   Respiratory: Negative.    Cardiovascular: Negative.   Gastrointestinal: Negative.   Genitourinary: Negative.   Musculoskeletal: Negative.   Skin: Negative.   Neurological: Negative.   Endo/Heme/Allergies: Negative.   Psychiatric/Behavioral: Negative.      Exam: @IPVITALS @ This is a well-developed and well-nourished white male in no obvious distress.  Vital signs show a temperature of 98.3.  Pulse 80.  Blood pressure 128/89.  Weight is 179 pounds.  Head and neck exam shows no ocular or oral lesions.  He has surgical scars from past surgeries for the thyroid and neck lymph node removal.  He has no palpable cervical or supraclavicular lymph nodes.  There is a small-2-3 mm-nodule that it feels like a cyst in the suboccipital area on the right side.  It is slightly mobile and nontender.  Lungs are clear bilaterally.  Cardiac exam regular rate and rhythm with no murmurs, rubs or bruits.  Abdomen is soft.  He has good bowel sounds.  There is no fluid wave.  There is no palpable liver or spleen tip.  Axillary exam shows no bilateral axillary adenopathy.  Inguinal exam shows no inguinal adenopathy bilaterally.  Back exam shows no tenderness over the  spine, ribs or hips.  Extremities shows no clubbing, cyanosis or edema.  He has good range of motion of his joints.  He has good pulses in his distal extremities.  Skin exam does show numerous hyperpigmented lesions on his body.  I will see anything that looks suspicious.  Neurological exam shows no focal neurological deficits.   Recent Labs    01/12/21 1120  WBC 7.3  HGB 15.3  HCT 46.5  PLT 262    Recent Labs    01/12/21 1120  NA 140  K 4.2  CL 102  CO2 32  GLUCOSE 92  BUN 24*  CREATININE 0.97  CALCIUM 9.9    Blood smear review: None  Pathology: None    Assessment and Plan: Mr. Aiken is a very nice 50 year old white male.  He has 2 separate malignancies.  The diffuse large cell non-Hodgkin's lymphoma was treated with R-CHOP.  He is now out almost 3 years from treatment.  I really do not think we have to do any scans on him from our point of view.  I really do not see that there is anything recurrence wise with respect to the lymphoma.  Again, I do still think this subcutaneous nodule in the right suboccipital region is malignant.  He sees endocrinology for the thyroid cancer.  I am sure they will help manage this.  He says he has a dermatologist for his skin.  I do think he does need to have dermatologic follow-up for his skin lesions.  He was followed every 6 months at [facility].  I think this is very reasonable.  He really lives close by.  He can always see [place] if there are any issues.  It was fun talking to him.  He likes to ride his bike.  He is certainly avid on the bicycle.

## 2021-01-15 ENCOUNTER — Telehealth: Payer: Self-pay | Admitting: Hematology & Oncology

## 2021-01-15 LAB — TSH: TSH: <0.08 u[IU]/mL — ABNORMAL LOW (ref 0.320–4.118)

## 2021-01-22 LAB — THYROGLOBULIN LEVEL: Thyroglobulin: <2 ng/mL

## 2021-01-23 ENCOUNTER — Telehealth: Payer: Self-pay | Admitting: Cardiovascular Disease

## 2021-01-23 NOTE — Telephone Encounter (Addendum)
Spoke to patient he stated he had fast heart beat twice yesterday.Stated heart rate 170's for about 5 mins.Stated he feels ok this morning.No fast heart beat.Advised to keep appointment already scheduled with Laurann Montana NP 11/17 at 2:45 pm.Advised to go to ED if he has any more fast heat beat.

## 2021-01-23 NOTE — Telephone Encounter (Signed)
STAT if HR is under 50 or over 120 (normal HR is 60-100 beats per minute)  What is your heart rate? Probably 51's doesn't have BP cuff with him while at work   Do you have a log of your heart rate readings (document readings)?  01/23/21 7:30 PM 174-175  Do you have any other symptoms? Dizziness to the point he felt he was going to faint   Patient c/o Palpitations:  High priority if patient c/o lightheadedness, shortness of breath, or chest pain  How long have you had palpitations/irregular HR/ Afib? Are you having the symptoms now? Has had for the past 5 years, yesterday was the first occurrence with dizziness to the point of feeling he would faint and tachycardia in the 170's. Reports no current symptoms   Are you currently experiencing lightheadedness, SOB or CP? No   Do you have a history of afib (atrial fibrillation) or irregular heart rhythm? Yes, but does not have any proof of that besides his own BP cuff  Have you checked your BP or HR? (document readings if available):  Yesterday 7:30 PM HR 174-175  Are you experiencing any other symptoms? Dizziness to the point of feeling he would faint      Patient is calling stating he had two episodes yesterday of tachycardia and palpitations. He states the first occurrence was yesterday at work around 3:00 PM it lasted for about 5 minutes and dizziness occurred with it. Later that day around 7:30 PM at home it occurred again lasting 10 minutes. He checked his HR and it first read "error" and then came back when checked again as 174-175. He reports he was so dizzy he felt he was going to pass out and had to lay down. He almost went to the hospital, but didn't after the 10 mins he felt fine and it has not occurred since. An appointment has been scheduled with Laurann Montana on 11/17. Is willing to come in sooner if possible. Will be unavailable tomorrow around 11:00-1:00 due to another appt. Please advise.

## 2021-01-25 ENCOUNTER — Ambulatory Visit (INDEPENDENT_AMBULATORY_CARE_PROVIDER_SITE_OTHER): Payer: 59 | Admitting: Family

## 2021-01-25 ENCOUNTER — Encounter (HOSPITAL_BASED_OUTPATIENT_CLINIC_OR_DEPARTMENT_OTHER): Payer: Self-pay | Admitting: Family

## 2021-01-25 ENCOUNTER — Other Ambulatory Visit: Payer: Self-pay

## 2021-01-25 VITALS — BP 114/80 | HR 88 | Ht 66.0 in | Wt 182.4 lb

## 2021-01-25 DIAGNOSIS — I10 Essential (primary) hypertension: Secondary | ICD-10-CM

## 2021-01-25 DIAGNOSIS — R072 Precordial pain: Secondary | ICD-10-CM | POA: Diagnosis not present

## 2021-01-25 DIAGNOSIS — R002 Palpitations: Secondary | ICD-10-CM

## 2021-01-25 MED ORDER — METOPROLOL TARTRATE 100 MG PO TABS: ORAL_TABLET | ORAL | 0 refills | Status: DC

## 2021-01-25 NOTE — Progress Notes (Signed)
Office Visit    Patient Name: Alex Mendoza Date of Encounter: 01/25/2021  PCP:  Orpah Melter, MD   [place]  Cardiologist:  Skeet Latch, MD  Advanced Practice Provider:  No care team member to display Electrophysiologist:  None      Chief Complaint    Alex Mendoza is a 50 y.o. male with a hx of hypertension, hyperlipidemia, large B cell lymphoma diagnosed 10/2017, thyroid cancer s/p thyroidectomy, bradycardia, palpitations  presents today for palpitations and chest discomfort   Past Medical History    Past Medical History:  Diagnosis Date   Bradycardia 07/20/2015   Hypercholesteremia    Hypertension    Lymphoma, diffuse ([place])    Palpitations 07/20/2015   Past Surgical History:  Procedure Laterality Date   DG GALL BLADDER     IR IMAGING GUIDED PORT INSERTION  11/28/2017   KNEE SURGERY Right    SHOULDER SURGERY Left     Allergies  Allergies  Allergen Reactions   Sulfa Antibiotics Hives and Rash    History of Present Illness    Alex Mendoza is a 50 y.o. male with a hx of hypertension, hyperlipidemia, large B cell lymphoma diagnosed 10/2017, thyroid cancer s/p thyroidectomy, bradycardia, palpitations last seen 02/2018.  Family history notable for coronary disease with father having a stent.   He was diagnosed 11/2017 with metastatic papillary thyroid carcinoma. Echo 02/04/18 LVEF 55-60%, gr1DD, mild MR. He was seen 02/2018 due to elevated heart rates and shoulder pain. His tachycardia was thought to be due to stress from lymphoma and chemo as well as steroid use. He was exercising routinely without exertional symptoms. Cardiac CT was discussed but deferred.   Last seen by Dr. Marin Olp of oncology 01/12/21. He had completed his lymphoma treatment at [facility]. He has completed his thyroidectomy and radioactive iodine treatment for papillary thyroid cancer. Additionally follows with endocrinology. He was planning to follow up at  [facility] every 6 months.   He contacted the office 01/23/21 noting fast heart beat the day prior in the 170s for 5 mins. Associated with near syncope. He was set up for clinic visit.  Presents today for follow up. Tells me this episodes of palpitations felt different than his previous. Felt lightheaded, palpitations all the sudden. He checked his heart rate with his blood pressure cuff and heart rates was in the 170s. This resolved on its own rest. Notes more stress than his usual that day. He had had occasional episodes of chest discomfort and note decreased tolerance for his regular biking. He from June to September was doing 500-600 miles per month but notes decreased distances recently. 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 He and his family had COVID 4 weeks ago but feels back to his usual state of health.  He sees [facility] - has an upcoming appointment to establish Dr. Soyla Murphy of local endocrinology. Tells me they keep his thyroid intentionally suppressed due to small nodule that was not able to be treated. Level just checked earlier this month.    EKGs/Labs/Other Studies Reviewed:   The following studies were reviewed today:  ECHO: 02/04/2018 - Left ventricle: The cavity size was normal. There was mild   concentric hypertrophy. Systolic function was normal. The   estimated ejection fraction was in the range of 55% to 60%. Wall   motion was normal; there were no regional wall motion   abnormalities. Doppler parameters are consistent with abnormal   left ventricular relaxation (grade 1 diastolic  dysfunction). - Mitral valve: There was mild regurgitation.  EKG:  EKG is ordered today.  The ekg ordered today demonstrates NSR 84 bpm with no acute ST/T wave changes.   Recent Labs: 01/12/2021: ALT 35; BUN 24; Creatinine 0.97; Hemoglobin 15.3; Platelet Count 262; Potassium 4.2; Sodium 140; TSH <0.080  Recent Lipid Panel No results found for: CHOL, TRIG, HDL, CHOLHDL, VLDL,  LDLCALC, LDLDIRECT  Home Medications   Current Meds  Medication Sig   amitriptyline (ELAVIL) 50 MG tablet Take 50 mg by mouth at bedtime.   levothyroxine (SYNTHROID) 175 MCG tablet Take 175 mcg by mouth daily.   metoprolol tartrate (LOPRESSOR) 100 MG tablet Please take 2 hours prior to Cardiac CTA   quinapril (ACCUPRIL) 20 MG tablet Take 20 mg by mouth daily.    rosuvastatin (CRESTOR) 10 MG tablet Take 10 mg by mouth daily.   senna-docusate (SENNA S) 8.6-50 MG tablet Take 2 tablets by mouth at bedtime.     Review of Systems      All other systems reviewed and are otherwise negative except as noted above.  Physical Exam    VS:  BP 114/80 (BP Location: Left Arm, Patient Position: Sitting, Cuff Size: Normal)   Pulse 88   Ht 5\' 6"  (1.676 m)   Wt 182 lb 6.4 oz (82.7 kg)   BMI 29.44 kg/m  , BMI Body mass index is 29.44 kg/m.  Wt Readings from Last 3 Encounters:  01/25/21 182 lb 6.4 oz (82.7 kg)  01/12/21 179 lb 1.9 oz (81.2 kg)  02/13/18 195 lb 12.8 oz (88.8 kg)     GEN: Well nourished, well developed, in no acute distress. HEENT: normal. Neck: Supple, no JVD, carotid bruits, or masses. Cardiac: RRR, no murmurs, rubs, or gallops. No clubbing, cyanosis, edema.  Radials/PT 2+ and equal bilaterally.  Respiratory:  Respirations regular and unlabored, clear to auscultation bilaterally. GI: Soft, nontender, nondistended. MS: No deformity or atrophy. Skin: Warm and dry, no rash. Neuro:  Strength and sensation are intact. Psych: Normal affect.  Assessment & Plan    Palpitations / Tachycardia - Isolated episode of palpitations while resting associated with lightheadedness consistent with SVT. As episodes infrequent, defer ZIO monitor. Recommend purchase home [place]. Electrolytes, thyroid, CBC checked earlier this month and unremarkable. Echo 2019 with normal LVEF and mild MR, low suspicion for progression, will not repeat echo at this time. As episodes are infrequent (last >1 year  ago) no need for medical therapy at this time but if episodes become for frequent could consider BB or CCB.   Chest pain in adult - EKG today no acute ST/T wave changes. Some typical features for angina as occurs with exertion of biking. Risk factors of family history, HTN, CA, gender. Plan for cardiac CTA. Metoprolol tartrate 100mg  PO 2 hours prior to scan.   HTN - BP well controlled. Continue current antihypertensive regimen.    Family history of coronary disease - Most recent LDL of 73 on Crestor 10mg  QD. Cardiac CTA, as above.   Lymphoma and thyroid cancer - Continue to follow with oncology.   Disposition: Follow up in 2 month(s) with Skeet Latch, MD or APP. If coronary CTA unremarkable could transition to 6 mos follow up if patient prefers.   Signed, Loel Dubonnet, NP 01/25/2021, 3:24 PM [facility]

## 2021-01-25 NOTE — Patient Instructions (Signed)
Medication Instructions:  Your Physician recommend you continue on your current medication as directed.    *If you need a refill on your cardiac medications before your next appointment, please call your pharmacy*   Lab Work: Your physician recommends that you return for lab work in: Today- BMP (3rd floor)  If you have labs (blood work) drawn today and your tests are completely normal, you will receive your results only by: MyChart Message (if you have MyChart) OR A paper copy in the mail If you have any lab test that is abnormal or we need to change your treatment, we will call you to review the results.   Testing/Procedures:   Your cardiac CT will be scheduled at one of the below locations:   [facility] [address] [place], [place] [address] [phone]  If scheduled at [facility], please arrive at the [facility] main entrance (entrance A) of [facility] 30 minutes prior to test start time. You can use the FREE valet parking offered at the main entrance (encouraged to control the heart rate for the test) Proceed to the [facility] Radiology Department (first floor) to check-in and test prep.   Please follow these instructions carefully (unless otherwise directed):  On the Night Before the Test: Be sure to Drink plenty of water. Do not consume any caffeinated/decaffeinated beverages or chocolate 12 hours prior to your test. Do not take any antihistamines 12 hours prior to your test.  On the Day of the Test: Drink plenty of water until 1 hour prior to the test. Do not eat any food 4 hours prior to the test. You may take your regular medications prior to the test.  Take metoprolol (Lopressor) two hours prior to test. HOLD Furosemide/Hydrochlorothiazide morning of the test.     After the Test: Drink plenty of water. After receiving IV contrast, you may experience a mild flushed feeling. This is normal. On occasion, you may experience a  mild rash up to 24 hours after the test. This is not dangerous. If this occurs, you can take Benadryl 25 mg and increase your fluid intake. If you experience trouble breathing, this can be serious. If it is severe call 911 IMMEDIATELY. If it is mild, please call our office. If you take any of these medications: Glipizide/Metformin, Avandament, Glucavance, please do not take 48 hours after completing test unless otherwise instructed.  Please allow 2-4 weeks for scheduling of routine cardiac CTs. Some insurance companies require a pre-authorization which may delay scheduling of this test.   For non-scheduling related questions, please contact the cardiac imaging nurse navigator should you have any questions/concerns: Marchia Bond, Cardiac Imaging Nurse Navigator Gordy Clement, Cardiac Imaging Nurse Navigator [facility] Direct Office Dial: [phone]   For scheduling needs, including cancellations and rescheduling, please call [place], [phone].    Follow-Up: At [facility], you and your health needs are our priority.  As part of our continuing mission to provide you with exceptional heart care, we have created designated Provider Care Teams.  These Care Teams include your primary Cardiologist (physician) and Advanced Practice Providers (APPs -  Physician Assistants and Nurse Practitioners) who all work together to provide you with the care you need, when you need it.  We recommend signing up for the patient portal called "MyChart".  Sign up information is provided on this After Visit Summary.  MyChart is used to connect with patients for Virtual Visits (Telemedicine).  Patients are  able to view lab/test results, encounter notes, upcoming appointments, etc.  Non-urgent messages can be sent to your provider as well.   To learn more about what you can do with MyChart, go to [email or web address].    Your next appointment:   2 month(s)  The format for your  next appointment:   In Person  Provider:   Skeet Latch, MD, Laurann Montana, NP, or Coletta Memos, NP    Other Instructions K[place]ia for monitoring of EKGs at home: You can look into the [facility] device by [employer].This device is purchased by you and it connects to an application you download to your smart phone.  It can detect abnormal heart rhythms and alert you to contact your doctor for further evaluation. The device is approximately $90 and the phone application is free.  The web site is:  https://www.alivecor.com    Your episode of fast heart beat sounds most like a rhythm called SVT or Supraventricular Tachycardia.   Supraventricular Tachycardia, Adult Supraventricular tachycardia (SVT) is a kind of abnormal heartbeat. It makes your heart beat very fast. This may last for a short time and then return to normal, or it may last longer. A normal resting heartbeat is 60-100 times a minute. This condition can make your heart beat more than 150 times a minute. Times of having a fast heartbeat (episodes) can be scary, but they are usually not dangerous. In some cases, they may lead to heart failure if they: Happen many times a day. Last longer than a few seconds. What are the causes? This condition happens when electrical signals are sent out from areas of the heart that do not normally send signals for the heartbeat. What increases the risk? You are more likely to develop this condition if you are: Middle aged or younger. Male. The following factors may also make you more likely to develop this condition: Stress. Feeling worried or nervous (anxiety). Tiredness. Smoking. Stimulant drugs, such as cocaine and methamphetamine. Alcohol. Caffeine. Pregnancy. Having certain medical conditions. What are the signs or symptoms? A pounding heart. A feeling that your heart is skipping beats (palpitations). Weakness. Trouble getting enough air. Pain or tightness in your  chest. Dizziness or feeling like you are going to pass out (faint). Feeling worried or nervous. Sweating. Feeling like you may vomit (nausea). Passing out. Tiredness. Sometimes, there are no symptoms. How is this treated? Treatment may include: Vagal nerve stimulation. Ways to do this include: Holding your breath and pushing, as though you are pooping (having a bowel movement). Massaging an area on one side of your neck. Do not try this yourself. Only a doctor should do this. If done the wrong way, it can lead to a stroke. Bending forward with your head between your legs. Coughing while bending forward with your head between your legs. Putting an ice-cold, wet towel on your face. Medicines that prevent attacks. Medicine to stop an attack given through an IV tube at the hospital. A small electric shock (cardioversion) that stops an attack. A procedure to get rid of cells in the area that is causing the fast heartbeats (radiofrequency ablation). If you do not have symptoms, you may not need treatment. Follow these instructions at home: Stress Avoid things that make you feel stressed. To deal with stress, try: Doing yoga or meditation. Being out in nature. Listening to relaxing music. Doing deep breathing. Taking steps to be healthy, such as getting lots of sleep, exercising, and eating a balanced diet. Talking with a mental  health doctor. Lifestyle  Try to get at least 7 hours of sleep each night. Do not smoke or use any products that contain nicotine or tobacco. If you need help quitting, ask your doctor. Do not drink alcohol if it gives you a fast heartbeat. If alcohol does not seem to give you a fast heartbeat, limit your alcohol use. If you drink alcohol: Limit how much you have to: 0-1 drink a day for women who are not pregnant. 0-2 drinks a day for men. Know how much alcohol is in your drink. In the [place] one drink equals one 12 oz bottle of beer (355 mL), one 5 oz glass  of wine (148 mL), or one 1 oz glass of hard liquor (44 mL). Be aware of how caffeine affects you. If caffeine gives you a fast heartbeat, do not eat, drink, or use anything with caffeine in it. If caffeine does not seem to give you a fast heartbeat, limit how much caffeine you eat, drink, or use. Do not use stimulant drugs. If you need help quitting, ask your doctor. General instructions Stay at a healthy weight. Exercise regularly. Ask your doctor about good activities for you. Try one or a mixture of these: 150 minutes a week of gentle exercise, like walking or yoga. 75 minutes a week of exercise that is very active, like running or swimming. Do vagus nerve treatments to slow down your heartbeat as told by your doctor. Take over-the-counter and prescription medicines only as told by your doctor. Keep all follow-up visits. Contact a doctor if: You have a fast heartbeat more often. Times of having a fast heartbeat last longer than before. Home treatments to slow down your heartbeat do not help. You have new symptoms. Get help right away if: You have chest pain. Your symptoms get worse. You have trouble breathing. Your heart beats very fast for more than 20 minutes. You pass out. These symptoms may be an emergency. Get medical help right away. Call your local emergency services (911 in the [place] Do not wait to see if the symptoms will go away. Do not drive yourself to the hospital. Summary SVT is a type of abnormal heartbeat. This condition can make your heart beat more than 150 times a minute. If you do not have symptoms, you may not need treatment. This information is not intended to replace advice given to you by your health care provider. Make sure you discuss any questions you have with your health care provider. Document Revised: 10/09/2019 Document Reviewed: 10/09/2019 Elsevier Patient Education  [place].

## 2021-02-07 ENCOUNTER — Encounter (HOSPITAL_COMMUNITY): Payer: Self-pay

## 2021-02-07 ENCOUNTER — Ambulatory Visit (HOSPITAL_COMMUNITY): Payer: 59 | Admitting: *Deleted

## 2021-02-07 NOTE — Telephone Encounter (Signed)
Reaching out to patient to offer assistance regarding upcoming cardiac imaging study; pt verbalizes understanding of appt date/time, parking situation and where to check in, pre-test NPO status and medications ordered, and verified current allergies; name and call back number provided for further questions should they arise  Alex Clement RN Navigator Cardiac Imaging [name] [phone] office [phone] cell  Patient to take 100mg  metoprolol tartrate two hours prior to cardiac CT scan.  He will hold his quinapril the night before the test and is aware to arrive at 8:30am for his 9am test.

## 2021-02-08 ENCOUNTER — Encounter (HOSPITAL_BASED_OUTPATIENT_CLINIC_OR_DEPARTMENT_OTHER): Payer: Self-pay

## 2021-02-08 ENCOUNTER — Other Ambulatory Visit: Payer: Self-pay | Admitting: Endocrinology

## 2021-02-08 ENCOUNTER — Ambulatory Visit (HOSPITAL_COMMUNITY)
Admission: RE | Admit: 2021-02-08 | Discharge: 2021-02-08 | Disposition: A | Discharge status: Home / Self Care | Payer: 59 | Source: Ambulatory Visit | Attending: Family | Admitting: Family

## 2021-02-08 ENCOUNTER — Other Ambulatory Visit: Payer: Self-pay

## 2021-02-08 DIAGNOSIS — R072 Precordial pain: Secondary | ICD-10-CM | POA: Insufficient documentation

## 2021-02-08 DIAGNOSIS — C73 Malignant neoplasm of thyroid gland: Secondary | ICD-10-CM

## 2021-02-08 MED ORDER — NITROGLYCERIN 0.4 MG SL SUBL
0.8000 mg | SUBLINGUAL_TABLET | Freq: Once | SUBLINGUAL | Status: AC
  Administered 2021-02-08: 0.8 mg via SUBLINGUAL

## 2021-02-08 MED ORDER — NITROGLYCERIN 0.4 MG SL SUBL
SUBLINGUAL_TABLET | SUBLINGUAL | Status: AC
  Filled 2021-02-08: qty 2

## 2021-02-08 MED ORDER — IOHEXOL 350 MG/ML SOLN
95.0000 mL | Freq: Once | INTRAVENOUS | Status: AC | PRN
  Administered 2021-02-08: 95 mL via INTRAVENOUS

## 2021-02-08 NOTE — Progress Notes (Signed)
Pt tolerated CT without issues.   Ambulated to elevator without complaints

## 2021-02-08 NOTE — Progress Notes (Signed)
Results shared via mychart

## 2021-02-21 ENCOUNTER — Ambulatory Visit
Admission: RE | Admit: 2021-02-21 | Discharge: 2021-02-21 | Disposition: A | Discharge status: Home / Self Care | Payer: 59 | Source: Ambulatory Visit | Attending: Endocrinology | Admitting: Endocrinology

## 2021-02-21 DIAGNOSIS — C73 Malignant neoplasm of thyroid gland: Secondary | ICD-10-CM

## 2021-03-04 ENCOUNTER — Other Ambulatory Visit: Payer: Self-pay

## 2021-03-04 ENCOUNTER — Emergency Department (HOSPITAL_BASED_OUTPATIENT_CLINIC_OR_DEPARTMENT_OTHER)
Admission: EM | Admit: 2021-03-04 | Discharge: 2021-03-04 | Disposition: A | Discharge status: Home / Self Care | Payer: 59 | Attending: Emergency Medicine | Admitting: Emergency Medicine

## 2021-03-04 ENCOUNTER — Encounter (HOSPITAL_BASED_OUTPATIENT_CLINIC_OR_DEPARTMENT_OTHER): Payer: Self-pay | Admitting: *Deleted

## 2021-03-04 DIAGNOSIS — Z79899 Other long term (current) drug therapy: Secondary | ICD-10-CM | POA: Diagnosis not present

## 2021-03-04 DIAGNOSIS — I10 Essential (primary) hypertension: Secondary | ICD-10-CM | POA: Insufficient documentation

## 2021-03-04 DIAGNOSIS — H938X1 Other specified disorders of right ear: Secondary | ICD-10-CM | POA: Diagnosis present

## 2021-03-04 DIAGNOSIS — H6011 Cellulitis of right external ear: Secondary | ICD-10-CM | POA: Diagnosis not present

## 2021-03-04 MED ORDER — CEPHALEXIN 500 MG PO CAPS: 500.0000 mg | ORAL_CAPSULE | Freq: Two times a day (BID) | ORAL | 0 refills | Status: AC

## 2021-03-04 MED ORDER — BACITRACIN ZINC 500 UNIT/GM EX OINT: 1.0000 "application " | TOPICAL_OINTMENT | Freq: Two times a day (BID) | CUTANEOUS | 0 refills | Status: DC

## 2021-03-04 NOTE — Discharge Instructions (Signed)
Overall suspect possible inflammatory/infectious process near recent surgical site.  Recommend bacitracin ointment twice a day to the right ear as well as oral antibiotics.  Follow-up with both your dermatologist and ENT.

## 2021-03-04 NOTE — ED Provider Notes (Signed)
[place] EMERGENCY DEPARTMENT Provider Note   CSN: [id number] Arrival date & time: 03/04/21  3532     History Chief Complaint  Patient presents with   Ear Problem    Alex Mendoza is a 50 y.o. male.  Patient with some swelling by right ear surgical site.  Had a basal cell carcinoma removed recently by dermatology.  Noticed some swelling by the lower earlobe/cartilage of the outer ear.  The history is provided by the patient.  Illness Location:  Right ear Severity:  Mild Onset quality:  Gradual Duration:  3 days Timing:  Constant Progression:  Worsening Chronicity:  New Relieved by:  Nothing Worsened by:  Nothing Associated symptoms: no congestion, no ear pain and no fever       Past Medical History:  Diagnosis Date   Bradycardia 07/20/2015   Hypercholesteremia    Hypertension    Lymphoma, diffuse ([place])    Palpitations 07/20/2015    Patient Active Problem List   Diagnosis Date Noted   Port-A-Cath in place 01/09/2018   Counseling regarding advance care planning and goals of care 12/09/2017   Diffuse large B-cell lymphoma of lymph nodes of multiple regions ([place]) 11/27/2017   Pain in left elbow 03/08/2016   Bradycardia 07/20/2015   Palpitations 07/20/2015    Past Surgical History:  Procedure Laterality Date   DG GALL BLADDER     IR IMAGING GUIDED PORT INSERTION  11/28/2017   KNEE SURGERY Right    SHOULDER SURGERY Left        Family History  Problem Relation Age of Onset   Atrial fibrillation Mother        s/p mini-Maze and ablation   Heart failure Maternal Grandmother    Heart failure Paternal Grandmother    Bradycardia Father    Heart disease Father 22   Atrial fibrillation Father        s/p ablation   Cancer Paternal Aunt     Social History   Tobacco Use   Smoking status: Never   Smokeless tobacco: Never  Vaping Use   Vaping Use: Never used  Substance Use Topics   Alcohol use: No    Alcohol/week: 0.0 standard drinks    Drug use: No    Home Medications Prior to Admission medications   Medication Sig Start Date End Date Taking? Authorizing Provider  bacitracin ointment Apply 1 application topically 2 (two) times daily. For 7 days 03/04/21  Yes Arleene Settle, DO  cephALEXin (KEFLEX) 500 MG capsule Take 1 capsule (500 mg total) by mouth 2 (two) times daily for 7 days. 03/04/21 03/11/21 Yes Ellianah Cordy, DO  amitriptyline (ELAVIL) 50 MG tablet Take 50 mg by mouth at bedtime. 01/25/20   [provider]  levothyroxine (SYNTHROID) 175 MCG tablet Take 175 mcg by mouth daily. 01/01/21   [provider]  metoprolol tartrate (LOPRESSOR) 100 MG tablet Please take 2 hours prior to Cardiac CTA 01/25/21   Loel Dubonnet, NP  quinapril (ACCUPRIL) 20 MG tablet Take 20 mg by mouth daily.     [provider]  rosuvastatin (CRESTOR) 10 MG tablet Take 10 mg by mouth daily. 12/30/20   [provider]  senna-docusate (SENNA S) 8.6-50 MG tablet Take 2 tablets by mouth at bedtime. 12/25/17   Brunetta Genera, MD    Allergies    Sulfa antibiotics  Review of Systems   Review of Systems  Constitutional:  Negative for fever.  HENT:  Negative for congestion, dental problem, drooling,  ear discharge, ear pain, facial swelling, hearing loss, nosebleeds and sinus pressure.   Skin:  Positive for color change and wound.   Physical Exam Updated Vital Signs BP (!) 135/112 (BP Location: Right Arm)    Pulse 79    Temp 97.7 F (36.5 C) (Oral)    Resp 18    Ht 5\' 8"  (1.727 m)    Wt 81.6 kg    SpO2 100%    BMI 27.37 kg/m   Physical Exam Constitutional:      General: He is not in acute distress.    Appearance: He is not ill-appearing.  HENT:     Head: Normocephalic and atraumatic.     Right Ear: Tympanic membrane and ear canal normal.     Left Ear: Tympanic membrane and ear canal normal.     Ears:     Comments: Surgical site to right ear with large granulation tissue with some mild erythema  but no purulent drainage or tenderness, outer cartilage area of the right earlobe with some mild swelling but there is no swelling external auditory canal, there is no abscess or fluctuance    Nose: Nose normal.     Mouth/Throat:     Mouth: Mucous membranes are moist.  Eyes:     Pupils: Pupils are equal, round, and reactive to light.  Neurological:     Mental Status: He is alert.    ED Results / Procedures / Treatments   Labs (all labs ordered are listed, but only abnormal results are displayed) Labs Reviewed - No data to display  EKG None  Radiology No results found.  Procedures Procedures   Medications Ordered in ED Medications - No data to display  ED Course  I have reviewed the triage vital signs and the nursing notes.  Pertinent labs & imaging results that were available during my care of the patient were reviewed by me and considered in my medical decision making (see chart for details).    MDM Rules/Calculators/A&P                          Alex Mendoza is here for evaluation of his right ear.  Recently had removal of basal cell carcinoma to the outer portion of the right auricle.  He is concerned about swelling around the outer earlobe.  Seems like this could be reactive tissue.  Could be a mild cellulitis.  No concern for abscess.  Patient is concerned that this could be a tumor.  Seems that it would have grown fairly quickly to be a tumor but we will conservatively treat with topical and oral antibiotics and have him follow-up with dermatology for possible biopsy if there is no improvement of the site.  We will also refer him to ENT.  Otherwise he appears well.  Discharged in good condition.  This chart was dictated using voice recognition software.  Despite best efforts to proofread,  errors can occur which can change the documentation meaning.     Final Clinical Impression(s) / ED Diagnoses Final diagnoses:  Cellulitis of right ear    Rx / DC Orders ED  Discharge Orders          Ordered    bacitracin ointment  2 times daily        03/04/21 0744    cephALEXin (KEFLEX) 500 MG capsule  2 times daily        03/04/21 0744  Lennice Sites, DO 03/04/21 [phone]

## 2021-03-04 NOTE — ED Triage Notes (Signed)
Has a "lump" on rt ear. States is very tender. Has had previous ear surgery

## 2021-03-30 ENCOUNTER — Ambulatory Visit (HOSPITAL_BASED_OUTPATIENT_CLINIC_OR_DEPARTMENT_OTHER): Payer: 59 | Admitting: Family

## 2021-07-13 ENCOUNTER — Encounter: Payer: Self-pay | Admitting: Hematology & Oncology

## 2021-07-13 ENCOUNTER — Inpatient Hospital Stay: Payer: 59 | Attending: Hematology & Oncology

## 2021-07-13 ENCOUNTER — Inpatient Hospital Stay (HOSPITAL_BASED_OUTPATIENT_CLINIC_OR_DEPARTMENT_OTHER): Payer: 59 | Admitting: Hematology & Oncology

## 2021-07-13 VITALS — BP 118/81 | HR 74 | Temp 98.4°F | Resp 20 | Wt 185.8 lb

## 2021-07-13 DIAGNOSIS — Z85820 Personal history of malignant melanoma of skin: Secondary | ICD-10-CM | POA: Insufficient documentation

## 2021-07-13 DIAGNOSIS — Z9221 Personal history of antineoplastic chemotherapy: Secondary | ICD-10-CM | POA: Insufficient documentation

## 2021-07-13 DIAGNOSIS — Z79899 Other long term (current) drug therapy: Secondary | ICD-10-CM | POA: Diagnosis not present

## 2021-07-13 DIAGNOSIS — C73 Malignant neoplasm of thyroid gland: Secondary | ICD-10-CM | POA: Diagnosis not present

## 2021-07-13 DIAGNOSIS — C8338 Diffuse large B-cell lymphoma, lymph nodes of multiple sites: Secondary | ICD-10-CM

## 2021-07-13 DIAGNOSIS — Z85850 Personal history of malignant neoplasm of thyroid: Secondary | ICD-10-CM | POA: Diagnosis present

## 2021-07-13 DIAGNOSIS — Z8572 Personal history of non-Hodgkin lymphomas: Secondary | ICD-10-CM | POA: Diagnosis present

## 2021-07-13 DIAGNOSIS — E890 Postprocedural hypothyroidism: Secondary | ICD-10-CM | POA: Insufficient documentation

## 2021-07-13 LAB — CMP (CANCER CENTER ONLY)
ALT: 31 U/L (ref 0–44)
AST: 29 U/L (ref 15–41)
Albumin: 4.3 g/dL (ref 3.5–5.0)
Alkaline Phosphatase: 61 U/L (ref 38–126)
Anion gap: 7 (ref 5–15)
BUN: 24 mg/dL — ABNORMAL HIGH (ref 6–20)
CO2: 30 mmol/L (ref 22–32)
Calcium: 9.1 mg/dL (ref 8.9–10.3)
Chloride: 103 mmol/L (ref 98–111)
Creatinine: 1.02 mg/dL (ref 0.61–1.24)
GFR, Estimated: >60 mL/min
Glucose, Bld: 70 mg/dL (ref 70–99)
Potassium: 3.9 mmol/L (ref 3.5–5.1)
Sodium: 140 mmol/L (ref 135–145)
Total Bilirubin: 0.6 mg/dL (ref 0.3–1.2)
Total Protein: 6.8 g/dL (ref 6.5–8.1)

## 2021-07-13 LAB — CBC WITH DIFFERENTIAL (CANCER CENTER ONLY)
Abs Immature Granulocytes: 0.03 10*3/uL (ref 0.00–0.07)
Basophils Absolute: 0 10*3/uL (ref 0.0–0.1)
Basophils Relative: 0 %
Eosinophils Absolute: 0.2 10*3/uL (ref 0.0–0.5)
Eosinophils Relative: 3 %
HCT: 44.2 % (ref 39.0–52.0)
Hemoglobin: 14.8 g/dL (ref 13.0–17.0)
Immature Granulocytes: 0 %
Lymphocytes Relative: 28 %
Lymphs Abs: 2.2 10*3/uL (ref 0.7–4.0)
MCH: 29.7 pg (ref 26.0–34.0)
MCHC: 33.5 g/dL (ref 30.0–36.0)
MCV: 88.6 fL (ref 80.0–100.0)
Monocytes Absolute: 0.6 10*3/uL (ref 0.1–1.0)
Monocytes Relative: 8 %
Neutro Abs: 4.6 10*3/uL (ref 1.7–7.7)
Neutrophils Relative %: 61 %
Platelet Count: 212 10*3/uL (ref 150–400)
RBC: 4.99 MIL/uL (ref 4.22–5.81)
RDW: 13.3 % (ref 11.5–15.5)
WBC Count: 7.6 10*3/uL (ref 4.0–10.5)
nRBC: 0 % (ref 0.0–0.2)

## 2021-07-13 LAB — LACTATE DEHYDROGENASE: LDH: 180 U/L (ref 98–192)

## 2021-07-13 NOTE — Progress Notes (Signed)
?Hematology and Oncology Follow Up Visit ? ?Alex Mendoza ?[id number] ?Jun 23, 1970 51 y.o. ?07/13/2021 ? ? ?Principle Diagnosis:  ?Diffuse large cell non-Hodgkin's lymphoma ?Papillary thyroid cancer ? ?Current Therapy:   ?Status post R-CHOP-completed 6 cycles in January  2020 ?Status post thyroidectomy and radioactive iodine ?    ?Interim History:  Alex Mendoza is back for follow-up.  This is a second office visit.  We saw him back in November.  He was transferred from [facility].  He lives in the area.  He works right down the street from our office. ? ?He has been doing well.  We saw him back in November.  He has had no problems since we last saw him.  He is exercising.  Likes to ride a bike.  He is quite vigorous. ? ?He has had no problems with fever.  He has had no issues with COVID. ? ?There is been no problems with nausea or vomiting.  He has had no problems with bowels or bladder. ? ?He has had no problem with rashes.  There is been no bleeding.  He has had no headache.  He has had no dysphagia or odynophagia. ? ?Overall, I would say his performance status is probably ECOG 0. ? ?Medications:  ?Current Outpatient Medications:  ?  amitriptyline (ELAVIL) 50 MG tablet, Take 50 mg by mouth at bedtime., Disp: , Rfl:  ?  levothyroxine (SYNTHROID) 175 MCG tablet, Take 175 mcg by mouth daily., Disp: , Rfl:  ?  rosuvastatin (CRESTOR) 10 MG tablet, Take 10 mg by mouth daily., Disp: , Rfl:  ?  metoprolol tartrate (LOPRESSOR) 100 MG tablet, Please take 2 hours prior to Cardiac CTA (Patient not taking: Reported on 07/13/2021), Disp: 1 tablet, Rfl: 0 ?No current facility-administered medications for this visit. ? ?Facility-Administered Medications Ordered in Other Visits:  ?  gadopentetate dimeglumine (MAGNEVIST) injection 18 mL, 18 mL, Intravenous, Once PRN, Penumalli, Earlean Polka, MD ? ?Allergies:  ?Allergies  ?Allergen Reactions  ? Sulfa Antibiotics Hives and Rash  ? ? ?Past Medical History, Surgical history, Social history, and  Family History were reviewed and updated. ? ?Review of Systems: ?Review of Systems  ?Constitutional: Negative.   ?HENT:  Negative.    ?Eyes: Negative.   ?Respiratory: Negative.    ?Cardiovascular: Negative.   ?Gastrointestinal: Negative.   ?Endocrine: Negative.   ?Genitourinary: Negative.    ?Musculoskeletal: Negative.   ?Skin: Negative.   ?Neurological: Negative.   ?Hematological: Negative.   ?Psychiatric/Behavioral: Negative.    ? ?Physical Exam: ? weight is 185 lb 12.8 oz (84.3 kg). His oral temperature is 98.4 ?F (36.9 ?C). His blood pressure is 118/81 and his pulse is 74. His respiration is 20 and oxygen saturation is 100%.  ? ?Wt Readings from Last 3 Encounters:  ?07/13/21 185 lb 12.8 oz (84.3 kg)  ?03/04/21 180 lb (81.6 kg)  ?01/25/21 182 lb 6.4 oz (82.7 kg)  ? ? ?Physical Exam ?Vitals reviewed.  ?HENT:  ?   Head: Normocephalic and atraumatic.  ?Eyes:  ?   Pupils: Pupils are equal, round, and reactive to light.  ?Cardiovascular:  ?   Rate and Rhythm: Normal rate and regular rhythm.  ?   Heart sounds: Normal heart sounds.  ?Pulmonary:  ?   Effort: Pulmonary effort is normal.  ?   Breath sounds: Normal breath sounds.  ?Abdominal:  ?   General: Bowel sounds are normal.  ?   Palpations: Abdomen is soft.  ?Musculoskeletal:     ?   General: No  tenderness or deformity. Normal range of motion.  ?   Cervical back: Normal range of motion.  ?Lymphadenopathy:  ?   Cervical: No cervical adenopathy.  ?Skin: ?   General: Skin is warm and dry.  ?   Findings: No erythema or rash.  ?Neurological:  ?   Mental Status: He is alert and oriented to person, place, and time.  ?Psychiatric:     ?   Behavior: Behavior normal.     ?   Thought Content: Thought content normal.     ?   Judgment: Judgment normal.  ? ? ? ?Lab Results  ?Component Value Date  ? WBC 7.6 07/13/2021  ? HGB 14.8 07/13/2021  ? HCT 44.2 07/13/2021  ? MCV 88.6 07/13/2021  ? PLT 212 07/13/2021  ? ?  Chemistry   ?   ?Component Value Date/Time  ? NA 140 07/13/2021 1000   ? K 3.9 07/13/2021 1000  ? CL 103 07/13/2021 1000  ? CO2 30 07/13/2021 1000  ? BUN 24 (H) 07/13/2021 1000  ? CREATININE 1.02 07/13/2021 1000  ?    ?Component Value Date/Time  ? CALCIUM 9.1 07/13/2021 1000  ? ALKPHOS 61 07/13/2021 1000  ? AST 29 07/13/2021 1000  ? ALT 31 07/13/2021 1000  ? BILITOT 0.6 07/13/2021 1000  ?  ? ? ?Impression and Plan: ?Alex Mendoza is a very nice 51 year old white male.  He has 2 separate malignancies.  He had the large cell lymphoma treated with chemotherapy.  I am sure that this is in remission and he is probably cured from this. ? ?He also has thyroid cancer.  He was treated with thyroidectomy and radioactive iodine.  I would had to believe that this is also cured. ? ?He is doing well.  I do not see any evidence of recurrent disease. ? ?I really think that he would like to come back yearly now.  I certainly have no problems with this.  If he does have any issues before then, he can was come back to see [place]. ? ?I am just very impressed with his level of activity.  He is in great shape.  He likes to bike ride.  I am so happy that his quality of life is so good. ? ? ?Alex Napoleon, MD ?5/5/20235:12 PM  ?

## 2021-08-22 ENCOUNTER — Other Ambulatory Visit: Payer: Self-pay | Admitting: Endocrinology

## 2021-08-22 DIAGNOSIS — C73 Malignant neoplasm of thyroid gland: Secondary | ICD-10-CM

## 2022-02-22 ENCOUNTER — Other Ambulatory Visit: Payer: 59

## 2022-02-22 ENCOUNTER — Ambulatory Visit
Admission: RE | Admit: 2022-02-22 | Discharge: 2022-02-22 | Disposition: A | Discharge status: Home / Self Care | Payer: 59 | Source: Ambulatory Visit | Attending: Endocrinology | Admitting: Endocrinology

## 2022-02-22 DIAGNOSIS — C73 Malignant neoplasm of thyroid gland: Secondary | ICD-10-CM

## 2022-04-28 IMAGING — US US THYROID
1 series · 13 of 25 positions shown · non-contrast
Comparison: 11/28/2017, 03/20/2019

CLINICAL DATA: Other. 50-year-old male with history of papillary
thyroid cancer status post total thyroidectomy. Follow-up study.

EXAM:
THYROID ULTRASOUND
TECHNIQUE: Ultrasound examination of the thyroid gland and adjacent soft
tissues was performed.

[Series 1: us thyroid · 0.08mm/px · 13 of 38 slices shown]
[im 1/38]
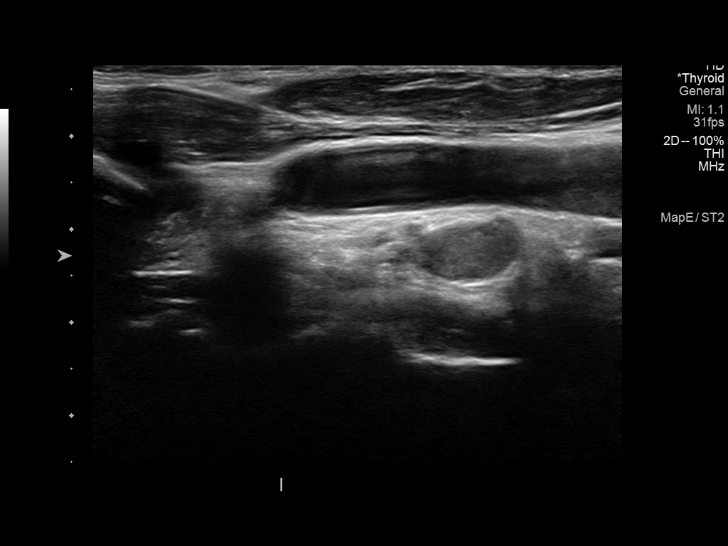
[im 4/38]
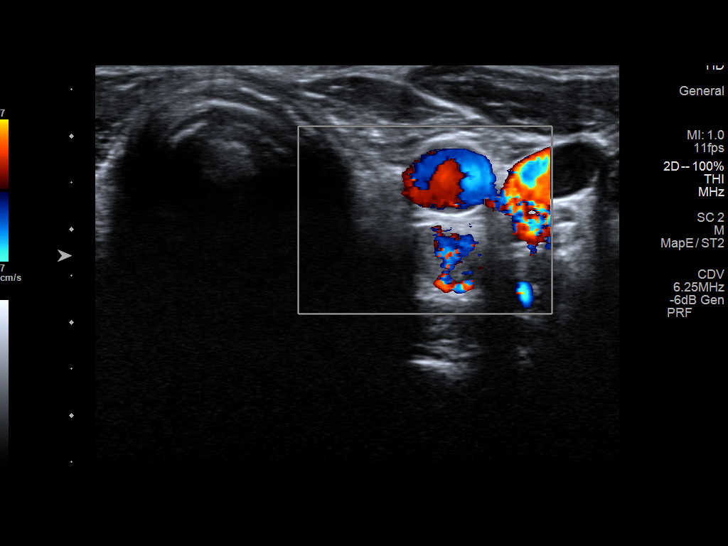
[im 7/38]
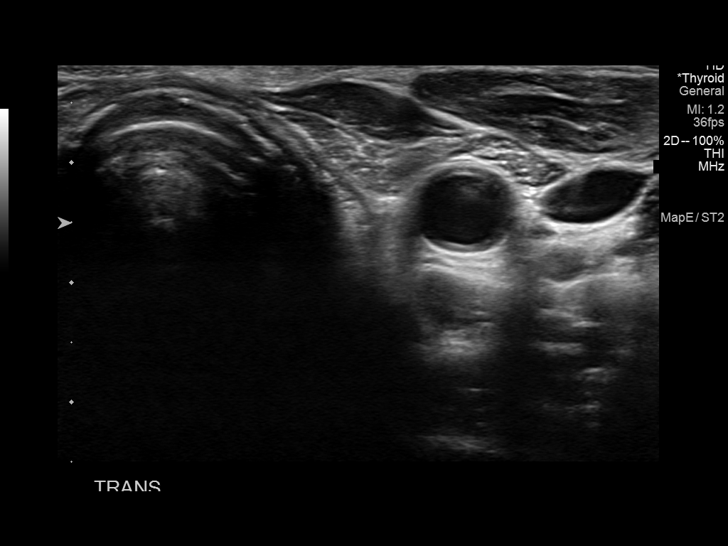
[im 10/38]
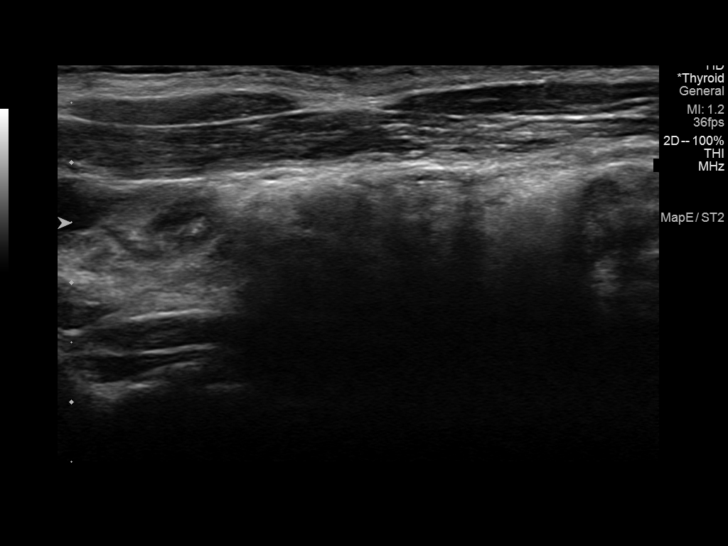
[im 13/38]
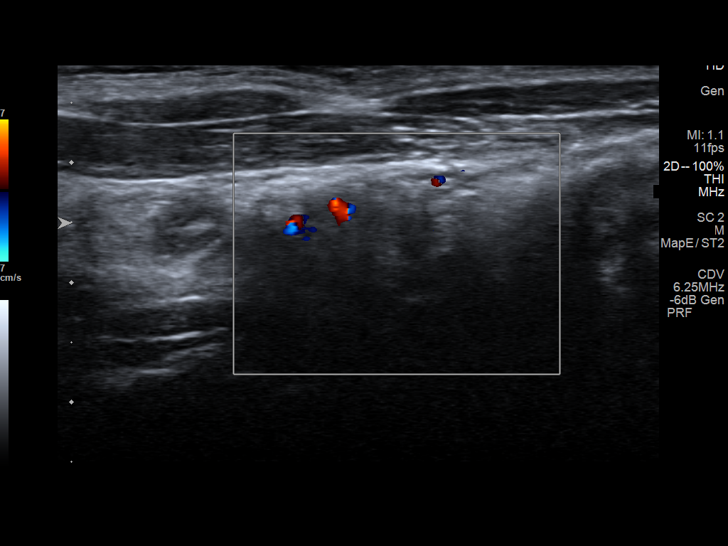
[im 16/38]
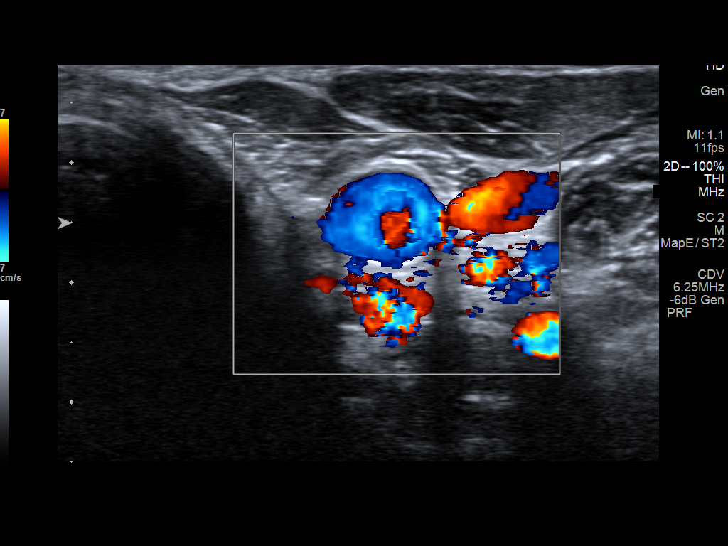
[im 19/38]
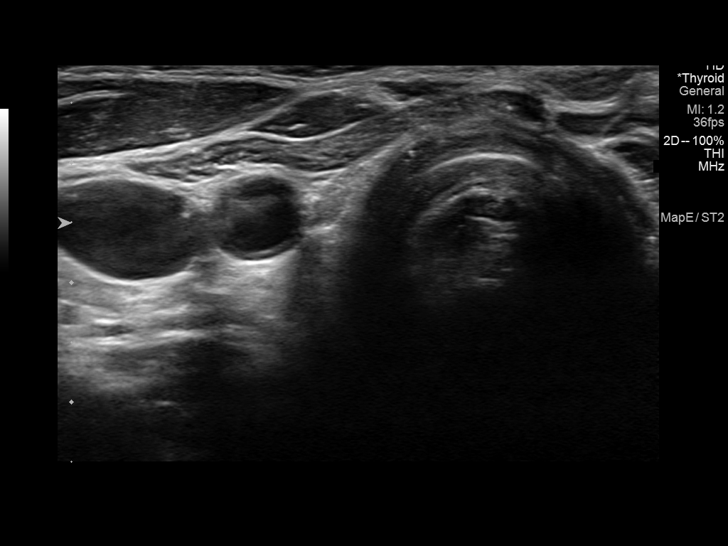
[im 22/38]
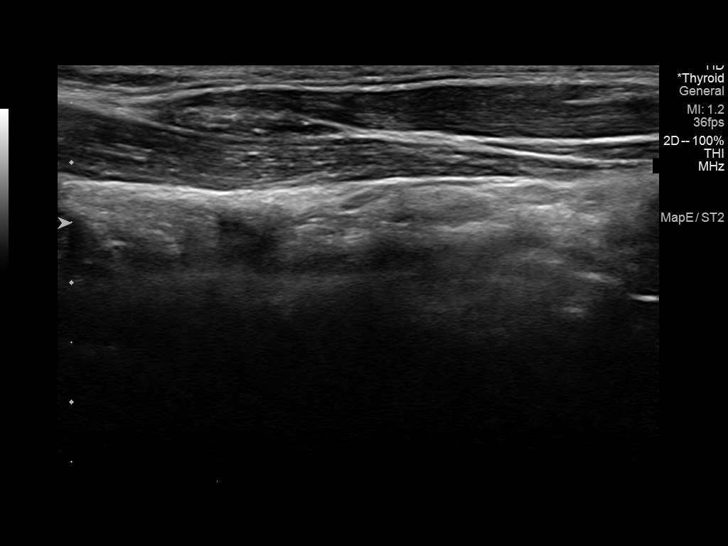
[im 25/38]
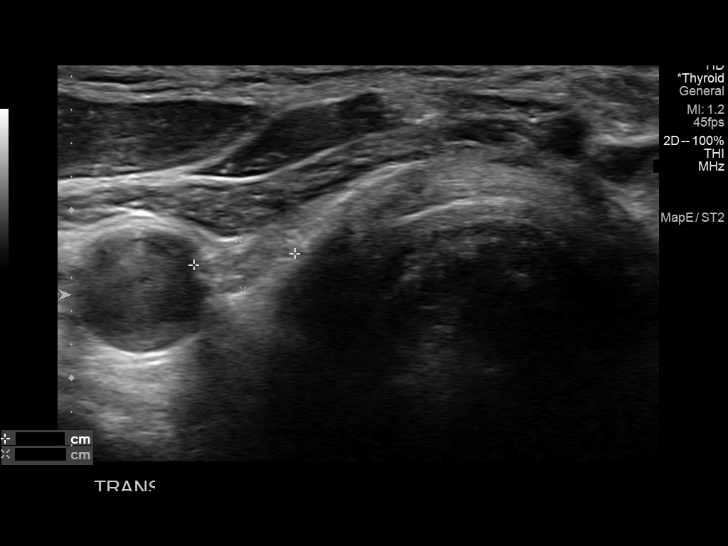
[im 28/38]
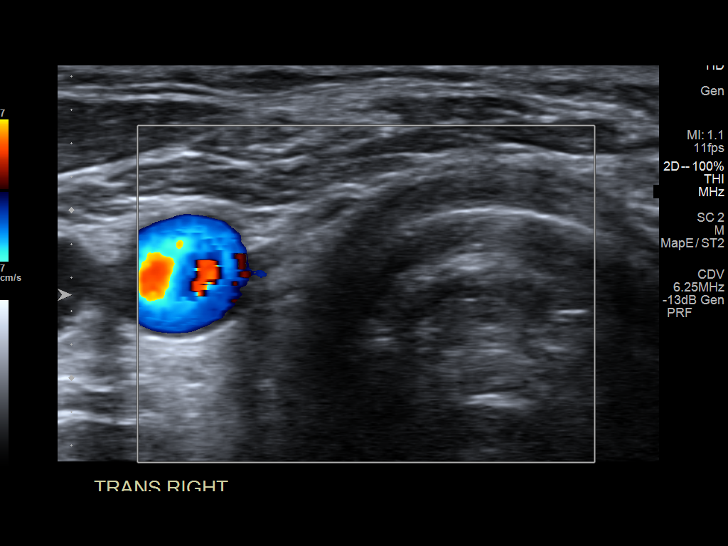
[im 31/38]
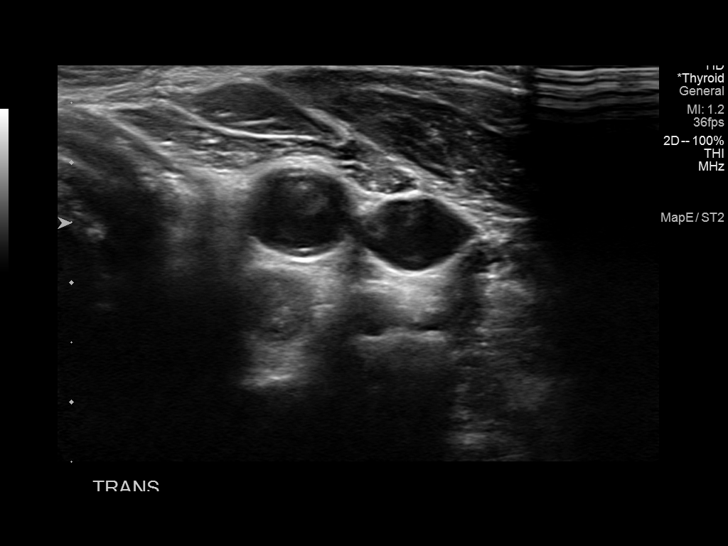
[im 34/38]
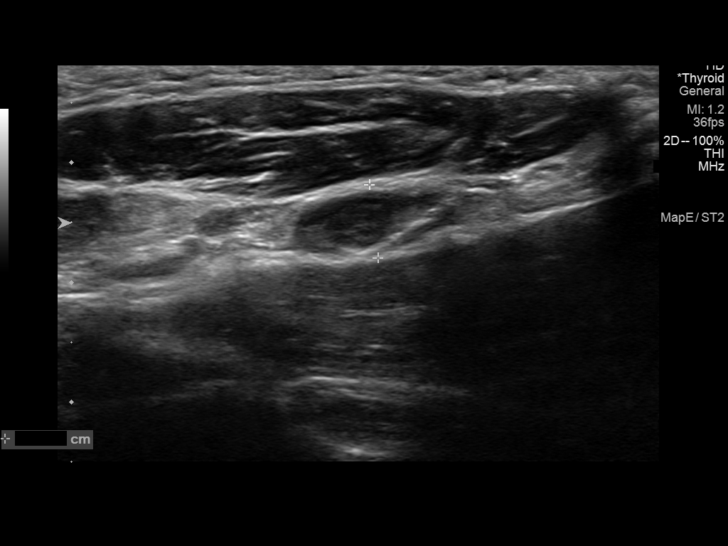
[im 38/38]
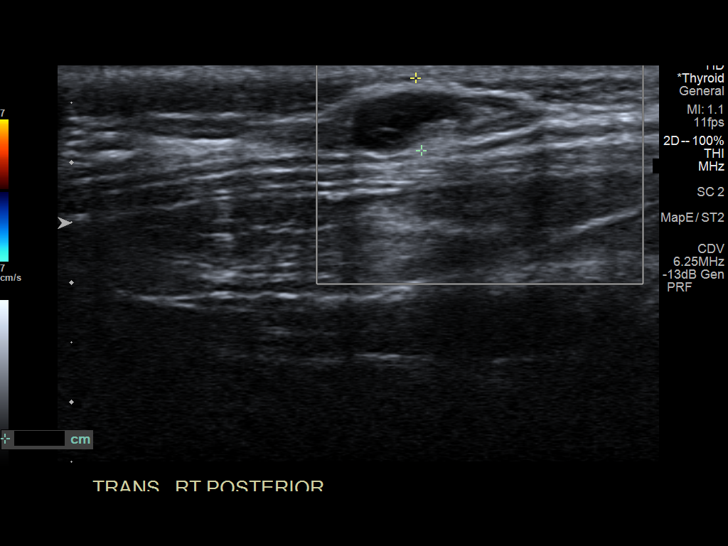

[13 of 25 positions shown; findings below may reference images not displayed]

FINDINGS: The thyroid gland is surgically absent. Within the right surgical
bed is a heterogeneously isoechoic focus of soft tissue measuring
approximately 1.2 x 0.3 x 0.6 cm. Within the left surgical bed there
is a heterogeneously isoechoic soft tissue focus measuring
approximately 1.4 x 0.6 x 0.5 cm. Just subjacent and posterior to
the left common carotid artery is a similar appearing
heterogeneously isoechoic soft tissue focus measuring approximately
1.8 x 0.7 x 1.0 cm, similar to location of enhancing nodule
comparison MRI. There is a left posterior occipital subcutaneous,
mildly prominent lymph node with preserved fatty hilum measuring up
to 0.6 cm in short axis. Within the visualized left neck is a mildly
prominent lymph node with preserved fatty hilum measuring up to
approximately 0.7 cm in short axis.
IMPRESSION: 1. Postsurgical changes after total thyroidectomy with small
residual thyroid parenchyma the bilateral surgical beds measuring up
to 1.2 cm on the right and 1.4 cm on the left.
2. Ovoid soft tissue density, hypervascular focus just posterior to
the hip left common carotid artery favored represent either the
enhancing lymph node on comparison MRI or additional focus of
residual thyroid parenchyma.
3. There are few scattered mildly prominent but pathologically
normal appearing lymph nodes in the left neck and right occipital
region. These are favored to be reactive based on sonographic
appearance.

## 2022-07-15 ENCOUNTER — Inpatient Hospital Stay: Payer: 59 | Attending: Hematology & Oncology

## 2022-07-15 ENCOUNTER — Inpatient Hospital Stay (HOSPITAL_BASED_OUTPATIENT_CLINIC_OR_DEPARTMENT_OTHER): Payer: 59 | Admitting: Hematology & Oncology

## 2022-07-15 ENCOUNTER — Other Ambulatory Visit: Payer: Self-pay

## 2022-07-15 DIAGNOSIS — Z8572 Personal history of non-Hodgkin lymphomas: Secondary | ICD-10-CM | POA: Diagnosis present

## 2022-07-15 DIAGNOSIS — Z79899 Other long term (current) drug therapy: Secondary | ICD-10-CM | POA: Insufficient documentation

## 2022-07-15 DIAGNOSIS — Z79890 Hormone replacement therapy (postmenopausal): Secondary | ICD-10-CM | POA: Insufficient documentation

## 2022-07-15 DIAGNOSIS — Z85850 Personal history of malignant neoplasm of thyroid: Secondary | ICD-10-CM | POA: Diagnosis present

## 2022-07-15 DIAGNOSIS — C8338 Diffuse large B-cell lymphoma, lymph nodes of multiple sites: Secondary | ICD-10-CM

## 2022-07-15 DIAGNOSIS — C73 Malignant neoplasm of thyroid gland: Secondary | ICD-10-CM

## 2022-07-15 DIAGNOSIS — Z9221 Personal history of antineoplastic chemotherapy: Secondary | ICD-10-CM | POA: Diagnosis not present

## 2022-07-15 DIAGNOSIS — E890 Postprocedural hypothyroidism: Secondary | ICD-10-CM | POA: Diagnosis not present

## 2022-07-15 LAB — CBC WITH DIFFERENTIAL (CANCER CENTER ONLY)
Abs Immature Granulocytes: 0.06 10*3/uL (ref 0.00–0.07)
Basophils Absolute: 0 10*3/uL (ref 0.0–0.1)
Basophils Relative: 0 %
Eosinophils Absolute: 0.2 10*3/uL (ref 0.0–0.5)
Eosinophils Relative: 2 %
HCT: 47.5 % (ref 39.0–52.0)
Hemoglobin: 15.9 g/dL (ref 13.0–17.0)
Immature Granulocytes: 1 %
Lymphocytes Relative: 25 %
Lymphs Abs: 1.8 10*3/uL (ref 0.7–4.0)
MCH: 29.2 pg (ref 26.0–34.0)
MCHC: 33.5 g/dL (ref 30.0–36.0)
MCV: 87.3 fL (ref 80.0–100.0)
Monocytes Absolute: 0.5 10*3/uL (ref 0.1–1.0)
Monocytes Relative: 6 %
Neutro Abs: 4.5 10*3/uL (ref 1.7–7.7)
Neutrophils Relative %: 66 %
Platelet Count: 223 10*3/uL (ref 150–400)
RBC: 5.44 MIL/uL (ref 4.22–5.81)
RDW: 13.6 % (ref 11.5–15.5)
WBC Count: 7 10*3/uL (ref 4.0–10.5)
nRBC: 0 % (ref 0.0–0.2)

## 2022-07-15 LAB — CMP (CANCER CENTER ONLY)
ALT: 37 U/L (ref 0–44)
AST: 30 U/L (ref 15–41)
Albumin: 4.6 g/dL (ref 3.5–5.0)
Alkaline Phosphatase: 54 U/L (ref 38–126)
Anion gap: 10 (ref 5–15)
BUN: 22 mg/dL — ABNORMAL HIGH (ref 6–20)
CO2: 29 mmol/L (ref 22–32)
Calcium: 9.2 mg/dL (ref 8.9–10.3)
Chloride: 100 mmol/L (ref 98–111)
Creatinine: 1.04 mg/dL (ref 0.61–1.24)
GFR, Estimated: >60 mL/min (ref >60)
Glucose, Bld: 110 mg/dL — ABNORMAL HIGH (ref 70–99)
Potassium: 3.9 mmol/L (ref 3.5–5.1)
Sodium: 139 mmol/L (ref 135–145)
Total Bilirubin: 0.7 mg/dL (ref 0.3–1.2)
Total Protein: 7.4 g/dL (ref 6.5–8.1)

## 2022-07-15 LAB — LACTATE DEHYDROGENASE: LDH: 210 U/L — ABNORMAL HIGH (ref 98–192)

## 2022-07-15 NOTE — Progress Notes (Signed)
Hematology and Oncology Follow Up Visit  Alex Mendoza [id number] Jul 09, 1970 52 y.o. 07/15/2022   Principle Diagnosis:  Diffuse large cell non-Hodgkin's lymphoma Papillary thyroid cancer  Current Therapy:   Status post R-CHOP-completed 6 cycles in January  2020 Status post thyroidectomy and radioactive iodine     Interim History:  Alex Mendoza is back for follow-up.  We see him once a year.  He has been doing quite well.  Has a new job.  He works at the same company but in a different division.  This happened probably couple weeks or so ago.  He still rides his bike.  He tries to ride this every day when the weather allows.  His family is doing quite well.  There is been no problems with COVID.  I think he may have had COVID last year.  He has had no issues with bowels or bladder.  He has had no rashes.  He has had no headache.  He has had no bleeding.  Overall, I would have said that his performance status is probably ECOG 0.    Medications:  Current Outpatient Medications:    amitriptyline (ELAVIL) 50 MG tablet, Take 50 mg by mouth at bedtime., Disp: , Rfl:    benazepril (LOTENSIN) 20 MG tablet, Take 20 mg by mouth daily., Disp: , Rfl:    levothyroxine (SYNTHROID) 175 MCG tablet, Take 175 mcg by mouth daily., Disp: , Rfl:    rosuvastatin (CRESTOR) 10 MG tablet, Take 10 mg by mouth daily., Disp: , Rfl:    metoprolol tartrate (LOPRESSOR) 100 MG tablet, Please take 2 hours prior to Cardiac CTA (Patient not taking: Reported on 07/13/2021), Disp: 1 tablet, Rfl: 0 No current facility-administered medications for this visit.  Facility-Administered Medications Ordered in Other Visits:    gadopentetate dimeglumine (MAGNEVIST) injection 18 mL, 18 mL, Intravenous, Once PRN, Penumalli, Glenford Bayley, MD  Allergies:  Allergies  Allergen Reactions   Sulfa Antibiotics Hives and Rash    Past Medical History, Surgical history, Social history, and Family History were reviewed and  updated.  Review of Systems: Review of Systems  Constitutional: Negative.   HENT:  Negative.    Eyes: Negative.   Respiratory: Negative.    Cardiovascular: Negative.   Gastrointestinal: Negative.   Endocrine: Negative.   Genitourinary: Negative.    Musculoskeletal: Negative.   Skin: Negative.   Neurological: Negative.   Hematological: Negative.   Psychiatric/Behavioral: Negative.      Physical Exam: Vital signs show temperature of 97.8.  Pulse 75.  Blood pressure 121/88.  Weight is 200 pounds.  Wt Readings from Last 3 Encounters:  07/13/21 185 lb 12.8 oz (84.3 kg)  03/04/21 180 lb (81.6 kg)  01/25/21 182 lb 6.4 oz (82.7 kg)    Physical Exam Vitals reviewed.  HENT:     Head: Normocephalic and atraumatic.  Eyes:     Pupils: Pupils are equal, round, and reactive to light.  Cardiovascular:     Rate and Rhythm: Normal rate and regular rhythm.     Heart sounds: Normal heart sounds.  Pulmonary:     Effort: Pulmonary effort is normal.     Breath sounds: Normal breath sounds.  Abdominal:     General: Bowel sounds are normal.     Palpations: Abdomen is soft.  Musculoskeletal:        General: No tenderness or deformity. Normal range of motion.     Cervical back: Normal range of motion.  Lymphadenopathy:     Cervical: No  cervical adenopathy.  Skin:    General: Skin is warm and dry.     Findings: No erythema or rash.  Neurological:     Mental Status: He is alert and oriented to person, place, and time.  Psychiatric:        Behavior: Behavior normal.        Thought Content: Thought content normal.        Judgment: Judgment normal.      Lab Results  Component Value Date   WBC 7.0 07/15/2022   HGB 15.9 07/15/2022   HCT 47.5 07/15/2022   MCV 87.3 07/15/2022   PLT 223 07/15/2022     Chemistry      Component Value Date/Time   NA 140 07/13/2021 1000   K 3.9 07/13/2021 1000   CL 103 07/13/2021 1000   CO2 30 07/13/2021 1000   BUN 24 (H) 07/13/2021 1000    CREATININE 1.02 07/13/2021 1000      Component Value Date/Time   CALCIUM 9.1 07/13/2021 1000   ALKPHOS 61 07/13/2021 1000   AST 29 07/13/2021 1000   ALT 31 07/13/2021 1000   BILITOT 0.6 07/13/2021 1000      Impression and Plan: Alex Mendoza is a very nice 52 year old white male.  He has 2 separate malignancies.  He had the large cell lymphoma treated with chemotherapy.  I am sure that this is in remission and he is probably cured from this.  He also has thyroid cancer.  He was treated with thyroidectomy and radioactive iodine.  I would had to believe that this is also cured.  He is doing well.  I do not see any evidence of recurrent disease.  We will still plan to get him back yearly.  I think we saw to follow him along.  He is always at risk for recurrence or another malignancy.   Josph Macho, MD 5/6/202412:57 PM

## 2022-07-19 ENCOUNTER — Other Ambulatory Visit: Payer: Self-pay | Admitting: *Deleted

## 2022-07-19 ENCOUNTER — Encounter: Payer: Self-pay | Admitting: Hematology & Oncology

## 2022-07-19 DIAGNOSIS — C73 Malignant neoplasm of thyroid gland: Secondary | ICD-10-CM

## 2022-07-19 DIAGNOSIS — C8338 Diffuse large B-cell lymphoma, lymph nodes of multiple sites: Secondary | ICD-10-CM

## 2022-07-23 LAB — THYROGLOBULIN LEVEL: Thyroglobulin: <2 ng/mL

## 2022-08-06 ENCOUNTER — Inpatient Hospital Stay: Payer: 59

## 2022-08-06 DIAGNOSIS — Z8572 Personal history of non-Hodgkin lymphomas: Secondary | ICD-10-CM | POA: Diagnosis not present

## 2022-08-06 DIAGNOSIS — C73 Malignant neoplasm of thyroid gland: Secondary | ICD-10-CM

## 2022-08-06 DIAGNOSIS — C8338 Diffuse large B-cell lymphoma, lymph nodes of multiple sites: Secondary | ICD-10-CM

## 2022-08-06 LAB — CBC WITH DIFFERENTIAL (CANCER CENTER ONLY)
Abs Immature Granulocytes: 0.04 10*3/uL (ref 0.00–0.07)
Basophils Absolute: 0 10*3/uL (ref 0.0–0.1)
Basophils Relative: 0 %
Eosinophils Absolute: 0.2 10*3/uL (ref 0.0–0.5)
Eosinophils Relative: 2 %
HCT: 48.1 % (ref 39.0–52.0)
Hemoglobin: 15.8 g/dL (ref 13.0–17.0)
Immature Granulocytes: 1 %
Lymphocytes Relative: 23 %
Lymphs Abs: 1.7 10*3/uL (ref 0.7–4.0)
MCH: 28.9 pg (ref 26.0–34.0)
MCHC: 32.8 g/dL (ref 30.0–36.0)
MCV: 88.1 fL (ref 80.0–100.0)
Monocytes Absolute: 0.6 10*3/uL (ref 0.1–1.0)
Monocytes Relative: 8 %
Neutro Abs: 4.8 10*3/uL (ref 1.7–7.7)
Neutrophils Relative %: 66 %
Platelet Count: 225 10*3/uL (ref 150–400)
RBC: 5.46 MIL/uL (ref 4.22–5.81)
RDW: 13.4 % (ref 11.5–15.5)
WBC Count: 7.4 10*3/uL (ref 4.0–10.5)
nRBC: 0 % (ref 0.0–0.2)

## 2022-08-06 LAB — CMP (CANCER CENTER ONLY)
ALT: 26 U/L (ref 0–44)
AST: 25 U/L (ref 15–41)
Albumin: 4.5 g/dL (ref 3.5–5.0)
Alkaline Phosphatase: 72 U/L (ref 38–126)
Anion gap: 10 (ref 5–15)
BUN: 22 mg/dL — ABNORMAL HIGH (ref 6–20)
CO2: 28 mmol/L (ref 22–32)
Calcium: 9.2 mg/dL (ref 8.9–10.3)
Chloride: 103 mmol/L (ref 98–111)
Creatinine: 1.21 mg/dL (ref 0.61–1.24)
GFR, Estimated: >60 mL/min (ref >60)
Glucose, Bld: 82 mg/dL (ref 70–99)
Potassium: 4 mmol/L (ref 3.5–5.1)
Sodium: 141 mmol/L (ref 135–145)
Total Bilirubin: 0.5 mg/dL (ref 0.3–1.2)
Total Protein: 6.8 g/dL (ref 6.5–8.1)

## 2022-08-06 LAB — TSH: TSH: 0.087 u[IU]/mL — ABNORMAL LOW (ref 0.350–4.500)

## 2022-08-06 LAB — LACTATE DEHYDROGENASE: LDH: 175 U/L (ref 98–192)

## 2022-10-19 ENCOUNTER — Other Ambulatory Visit: Payer: Self-pay

## 2022-10-19 ENCOUNTER — Emergency Department (HOSPITAL_COMMUNITY)
Admission: EM | Admit: 2022-10-19 | Discharge: 2022-10-19 | Disposition: A | Discharge status: Home / Self Care | Payer: 59 | Source: Home / Self Care | Attending: Emergency Medicine | Admitting: Emergency Medicine

## 2022-10-19 ENCOUNTER — Encounter (HOSPITAL_COMMUNITY): Payer: Self-pay

## 2022-10-19 ENCOUNTER — Emergency Department (HOSPITAL_COMMUNITY): Payer: 59

## 2022-10-19 DIAGNOSIS — I471 Supraventricular tachycardia, unspecified: Secondary | ICD-10-CM | POA: Diagnosis present

## 2022-10-19 LAB — CBC
HCT: 46.9 % (ref 39.0–52.0)
Hemoglobin: 15.6 g/dL (ref 13.0–17.0)
MCH: 29.3 pg (ref 26.0–34.0)
MCHC: 33.3 g/dL (ref 30.0–36.0)
MCV: 88 fL (ref 80.0–100.0)
Platelets: 201 10*3/uL (ref 150–400)
RBC: 5.33 MIL/uL (ref 4.22–5.81)
RDW: 13.2 % (ref 11.5–15.5)
WBC: 8.6 10*3/uL (ref 4.0–10.5)
nRBC: 0 % (ref 0.0–0.2)

## 2022-10-19 LAB — BASIC METABOLIC PANEL
Anion gap: 12 (ref 5–15)
BUN: 22 mg/dL — ABNORMAL HIGH (ref 6–20)
CO2: 24 mmol/L (ref 22–32)
Calcium: 8.6 mg/dL — ABNORMAL LOW (ref 8.9–10.3)
Chloride: 105 mmol/L (ref 98–111)
Creatinine, Ser: 1.34 mg/dL — ABNORMAL HIGH (ref 0.61–1.24)
GFR, Estimated: >60 mL/min (ref >60)
Glucose, Bld: 97 mg/dL (ref 70–99)
Potassium: 4.1 mmol/L (ref 3.5–5.1)
Sodium: 141 mmol/L (ref 135–145)

## 2022-10-19 LAB — TROPONIN I (HIGH SENSITIVITY): Troponin I (High Sensitivity): 15 ng/L (ref <18)

## 2022-10-19 LAB — MAGNESIUM: Magnesium: 1.8 mg/dL (ref 1.7–2.4)

## 2022-10-19 MED ORDER — LACTATED RINGERS IV BOLUS
1000.0000 mL | Freq: Once | INTRAVENOUS | Status: AC
  Administered 2022-10-19: 1000 mL via INTRAVENOUS

## 2022-10-19 NOTE — ED Notes (Signed)
Main lab to add mag to existing blood work

## 2022-10-19 NOTE — ED Triage Notes (Signed)
Patient BIB [facility] from home with complaint of SVT. Vagal unsuccessful. Patient self administered 162 ASA   HR 180 SVT EMS administered 6mg  aden 12mg  aden 750 ML NS bolus  BP 130/90 100% RA HR 80

## 2022-10-19 NOTE — ED Provider Notes (Signed)
[place] EMERGENCY DEPARTMENT AT [facility] Provider Note   CSN: [id number] Arrival date & time: 10/19/22  2045     History  Chief Complaint  Patient presents with   SVT    Alex Mendoza is a 52 y.o. male.  HPI 52 year old male presents with SVT.  He did not feel the greatest while playing pickle ball last night and had to stop early which he thinks was related to the heat.  Had a little bit of a headache.  Had some more headache and a couple episodes of diarrhea and just generalized malaise today.  However around 7 PM after dinner he started feeling palpitations.  This did not go away and he checked his Kardia app and his heart rate was in the 180s.  He called EMS.  They tried adenosine and then vagal maneuvers and then a larger dose of adenosine with conversion.  He states now he feels fine.  He was feeling lightheaded when he had the tachycardia but denies any chest pain or shortness of breath.  Otherwise no recent illness.  He states sometimes with stress he will have palpitations, like what he had tonight but it never last this long.  Home Medications Prior to Admission medications   Medication Sig Start Date End Date Taking? Authorizing Provider  amitriptyline (ELAVIL) 50 MG tablet Take 50 mg by mouth at bedtime. 01/25/20   [provider]  benazepril (LOTENSIN) 20 MG tablet Take 20 mg by mouth daily. 06/17/22   [provider]  levothyroxine (SYNTHROID) 175 MCG tablet Take 175 mcg by mouth daily. 01/01/21   [provider]  metoprolol tartrate (LOPRESSOR) 100 MG tablet Please take 2 hours prior to Cardiac CTA Patient not taking: Reported on 07/13/2021 01/25/21   Alver Sorrow, NP  rosuvastatin (CRESTOR) 10 MG tablet Take 10 mg by mouth daily. 12/30/20   [provider]      Allergies    Sulfa antibiotics    Review of Systems   Review of Systems  Constitutional:  Negative for fever.  Respiratory:  Negative for shortness  of breath.   Cardiovascular:  Positive for palpitations. Negative for chest pain.  Gastrointestinal:  Positive for diarrhea. Negative for abdominal pain.    Physical Exam Updated Vital Signs BP 130/87   Pulse 100   Temp 98.1 F (36.7 C) (Oral)   Resp 20   Ht 5\' 8"  (1.727 m)   Wt 88.5 kg   SpO2 95%   BMI 29.65 kg/m  Physical Exam Vitals and nursing note reviewed.  Constitutional:      General: He is not in acute distress.    Appearance: He is well-developed. He is not ill-appearing or diaphoretic.  HENT:     Head: Normocephalic and atraumatic.  Cardiovascular:     Rate and Rhythm: Normal rate and regular rhythm.     Heart sounds: Normal heart sounds.  Pulmonary:     Effort: Pulmonary effort is normal.     Breath sounds: Normal breath sounds.  Abdominal:     Palpations: Abdomen is soft.     Tenderness: There is no abdominal tenderness.  Skin:    General: Skin is warm and dry.  Neurological:     Mental Status: He is alert.     ED Results / Procedures / Treatments   Labs (all labs ordered are listed, but only abnormal results are displayed) Labs Reviewed  BASIC METABOLIC PANEL - Abnormal; Notable for the following components:  Result Value   BUN 22 (*)    Creatinine, Ser 1.34 (*)    Calcium 8.6 (*)    All other components within normal limits  CBC  TROPONIN I (HIGH SENSITIVITY)    EKG EKG Interpretation Date/Time:  Saturday October 19 2022 20:51:05 EDT Ventricular Rate:  80 PR Interval:  159 QRS Duration:  92 QT Interval:  395 QTC Calculation: 456 R Axis:   -1  Text Interpretation: Sinus rhythm Borderline T wave abnormalities No old tracing to compare Confirmed by Pricilla Loveless [phone]) on 10/19/2022 9:41:15 PM  Prehospital EKG:  Radiology DG Chest 2 View  Result Date: 10/19/2022 CLINICAL DATA:  Supraventricular tachycardia. EXAM: CHEST - 2 VIEW COMPARISON:  CT coronary angiogram limited chest CT 02/08/2021, portable chest 08/17/2018. FINDINGS:  The heart size and mediastinal contours are within normal limits. Both lungs are clear. The visualized skeletal structures are unremarkable. IMPRESSION: No evidence of acute chest disease. Electronically Signed   By: Almira Bar M.D.   On: 10/19/2022 21:35    Procedures Procedures    Medications Ordered in ED Medications  lactated ringers bolus 1,000 mL (has no administration in time range)    ED Course/ Medical Decision Making/ A&P                                 Medical Decision Making Amount and/or Complexity of Data Reviewed Labs: ordered.    Details: Normal hemoglobin and troponin.  Magnesium normal.  Potassium normal. Radiology: ordered and independent interpretation performed.    Details: No CHF. ECG/medicine tests: ordered and independent interpretation performed.    Details: No ischemia, normal rhythm.   Patient presents with SVT.  Treated by EMS and successful.  No clear cause besides maybe some mild dehydration.  He was given some fluids here.  Electrolytes are unremarkable.  He had no anginal symptoms and so while troponin was sent by the nurse prior to me seeing him, I do not think a second 1 is needed as I think ACS is highly unlikely.  Will refer him to cardiology but otherwise with his vitals being normal and him feeling fine I think he is stable for discharge home with return precautions.        Final Clinical Impression(s) / ED Diagnoses Final diagnoses:  None    Rx / DC Orders ED Discharge Orders     None         Pricilla Loveless, MD 10/19/22 2248

## 2022-10-19 NOTE — Discharge Instructions (Signed)
You had a heart rhythm disorder called supraventricular tachycardia or SVT.  This is an abnormal heart rhythm that goes very fast.  You can try different vagal maneuvers to try to break this if this recurs again but if it does not help then you will need to call 911 and/or return to the ER.  Otherwise you are being referred to cardiology for follow-up.  Try to limit stress and caffeine intake.

## 2023-03-13 ENCOUNTER — Other Ambulatory Visit: Payer: Self-pay | Admitting: Endocrinology

## 2023-03-13 DIAGNOSIS — E890 Postprocedural hypothyroidism: Secondary | ICD-10-CM

## 2023-03-24 ENCOUNTER — Ambulatory Visit
Admission: RE | Admit: 2023-03-24 | Discharge: 2023-03-24 | Disposition: A | Discharge status: Home / Self Care | Payer: 59 | Source: Ambulatory Visit | Attending: Endocrinology | Admitting: Endocrinology

## 2023-03-24 DIAGNOSIS — E890 Postprocedural hypothyroidism: Secondary | ICD-10-CM

## 2023-03-30 ENCOUNTER — Encounter (HOSPITAL_BASED_OUTPATIENT_CLINIC_OR_DEPARTMENT_OTHER): Payer: Self-pay | Admitting: Cardiovascular Disease

## 2023-03-30 NOTE — Progress Notes (Unsigned)
Cardiology Office Note    Date:  04/02/2023  ID:  Alex Mendoza, DOB 1970/05/18, MRN [id number] PCP:  Alex Rua, MD  Cardiologist:  Chilton Si, MD  Electrophysiologist:  None   Chief Complaint: PVCs/PACs   History of Present Illness: .    Alex Mendoza is a 53 y.o. male with visit-pertinent history of hypertension, hyperlipidemia, large B-cell lymphoma diagnosed in 10/2017, thyroid cancer s/p thyroidectomy, bradycardia and palpitations.  In 11/2017 he was diagnosed with metastatic papillary thyroid carcinoma.  Echo on 02/04/2018 indicated LVEF of 55 to 60%, grade 1 DD, mild MR.  He was seen in 02/2018 due to elevated heart rates and shoulder pain.  His tachycardia was thought to be due to stress from lymphoma and chemo as well as steroid use.  Cardiac CT was discussed but deferred.  On 01/23/2021 he notified the office regarding fast heartbeats the day prior in the 170s for 5 minutes.  Associate with near syncope.  He was seen in clinic on 01/25/2021.  He reported that his palpitations felt different than his previous.  He felt lightheaded, palpations all of a sudden.  He checked his heart rate with his blood pressure cuff and his heart rate was in the 170s postdated this resolved on its own with rest.  Noted more stress than usual than that day.  He had occasional episodes of chest discomfort and noted decreased tolerance for his regular biking.  It was recommended that he have a cardiac CTA and monitor his palpitations with a Kardia mobile.  Coronary CTA on 02/08/2021 indicated a coronary calcium score of 0.  Patient was then lost to follow-up.  On chart review patient presented to the ED on 10/19/2022 for SVT.  Patient reported that he had not been feeling great, he then reported a few episodes of diarrhea and generalized malaise that day.  At dinner he started feeling palpitations that did not go away and he checked his cardia app and his heart rate was in the 180s.  EMS was  called and they tried adenosine and vagal maneuvers then a larger dose of adenosine with conversion.  He reported feeling slightly lightheaded when the tachycardia occurred but denied any chest pain or shortness of breath.  There were felt to be no clear causes aside from some mild dehydration, his creatinine was mildly elevated at 1.34, potassium 4.1, sodium 141, magnesium 1.8.  His high sensitive troponin was negative at 15.  He was given a liter of lactated Ringer's and discharged with recommendation to follow-up with cardiology.  Today patient presents regarding irregularity on his Kardia mobile.  Patient reports that last week he was seen by a another doctor that noted that his heart rhythm sounded slightly irregular.  He monitored his [name] mobile throughout the weekend and reported that it indicated an undetectable rhythm.  On review of the rhythm strips he was in normal sinus rhythm with occasional PACs/PVCs.  Patient denied any symptoms of palpitations, presyncope or syncope.  Patient notes that this previous summer he had occasional episodes of fast heart rates that quickly resolved on their own, aside from his 1 hospital visit.  He denies any recent episodes of SVT, presyncope or syncope.  He notes that he regularly exercises and plays pickle ball multiple times a week without any chest pain or shortness of breath.  He also notes that he has recently had a some slight muffled hearing, he plans to follow-up with his PCP regarding this at his physical next month.  ROS: .   Today denies chest pain, shortness of breath, lower extremity edema, fatigue, palpitations, melena, hematuria, hemoptysis, diaphoresis, weakness, presyncope, syncope, orthopnea, and PND.  All other systems are reviewed and otherwise negative. Studies Reviewed: Marland Kitchen    EKG:  EKG is ordered today, personally reviewed, demonstrating  EKG Interpretation Date/Time:  Wednesday April 02 2023 11:23:16 EST Ventricular Rate:  66 PR  Interval:  170 QRS Duration:  90 QT Interval:  436 QTC Calculation: 457 R Axis:   -25  Text Interpretation: Normal sinus rhythm Nonspecific T wave abnormality No significant change since last tracing Confirmed by Reather Littler [phone]) on 04/02/2023 12:18:43 PM   CV Studies:  Cardiac Studies & Procedures     STRESS TESTS  EXERCISE TOLERANCE TEST (ETT) 08/30/2015  Narrative  There was no ST segment deviation noted during stress.  Normal ETT at an excellent work load  ECHOCARDIOGRAM  ECHOCARDIOGRAM COMPLETE 02/04/2018  Narrative *[facility]* [address] [place], [place] [address] [phone]  ------------------------------------------------------------------- Transthoracic Echocardiography  Patient:    Alex, Mendoza MR #:       [id number] Study Date: 02/04/2018 Gender:     M Age:        47 Height:     172.7 cm Weight:     88 kg BSA:        2.08 m^2 Pt. Status: Room:  ATTENDING    Default, Provider [phone] Gwynneth Aliment [id number] PERFORMING   [facility] [place] SONOGRAPHER  Sinda Du, RDCS ORDERING     Galal, Ahmed REFERRING    Galal, Ahmed  cc:  ------------------------------------------------------------------- LV EF: 55% -   60%  ------------------------------------------------------------------- History:   PMH:  Palpitation. Family history of cardiomyopathy. Risk factors:  Hypertension. Dyslipidemia.  ------------------------------------------------------------------- Study Conclusions  - Left ventricle: The cavity size was normal. There was mild concentric hypertrophy. Systolic function was normal. The estimated ejection fraction was in the range of 55% to 60%. Wall motion was normal; there were no regional wall motion abnormalities. Doppler parameters are consistent with abnormal left ventricular relaxation (grade 1 diastolic dysfunction). - Mitral valve: There was mild  regurgitation.  ------------------------------------------------------------------- Study data:  Comparison was made to the study of 11/21/2017.  Study status:  Routine.  Procedure:  The patient reported no pain pre or post test. Transthoracic echocardiography. Image quality was adequate.  Study completion:  There were no complications. Transthoracic echocardiography.  M-mode, complete 2D, spectral Doppler, and color Doppler.  Birthdate:  Patient birthdate: 06-16-1970.  Age:  Patient is 53 yr old.  Sex:  Gender: male. BMI: 29.5 kg/m^2.  Blood pressure:     124/88  Patient status: Outpatient.  Study date:  Study date: 02/04/2018. Study time: 03:20 PM.  Location:  Echo laboratory.  -------------------------------------------------------------------  ------------------------------------------------------------------- Left ventricle:  The cavity size was normal. There was mild concentric hypertrophy. Systolic function was normal. The estimated ejection fraction was in the range of 55% to 60%. Wall motion was normal; there were no regional wall motion abnormalities. Doppler parameters are consistent with abnormal left ventricular relaxation (grade 1 diastolic dysfunction). There was no evidence of elevated ventricular filling pressure by Doppler parameters.  ------------------------------------------------------------------- Aortic valve:   Trileaflet; normal thickness leaflets. Mobility was not restricted.  Doppler:  Transvalvular velocity was within the normal range. There was no stenosis. There was no regurgitation.  ------------------------------------------------------------------- Aorta:  The aorta was normal, not dilated, and non-diseased. Aortic root: The aortic root was normal in size.  -------------------------------------------------------------------  Mitral valve:   Structurally normal valve.   Mobility was not restricted.  Doppler:  Transvalvular velocity was within the  normal range. There was no evidence for stenosis. There was mild regurgitation.    Valve area by pressure half-time: 2.56 cm^2. Indexed valve area by pressure half-time: 1.23 cm^2/m^2.  ------------------------------------------------------------------- Left atrium:  The atrium was normal in size.  ------------------------------------------------------------------- Atrial septum:  The septum was normal.  ------------------------------------------------------------------- Right ventricle:  The cavity size was normal. Wall thickness was normal. Systolic function was normal.  ------------------------------------------------------------------- Pulmonic valve:    Structurally normal valve. The valve appears to be grossly normal.   Cusp separation was normal.  Doppler: Transvalvular velocity was within the normal range. There was no evidence for stenosis. There was no regurgitation.  ------------------------------------------------------------------- Tricuspid valve:   Structurally normal valve.    Doppler: Transvalvular velocity was within the normal range. There was no regurgitation.  ------------------------------------------------------------------- Pulmonary artery:   The main pulmonary artery was normal-sized. Systolic pressure was within the normal range.  ------------------------------------------------------------------- Right atrium:  The atrium was normal in size.  ------------------------------------------------------------------- Pericardium:  The pericardium was normal in appearance. There was no pericardial effusion.  ------------------------------------------------------------------- Systemic veins: Inferior vena cava: The vessel was normal in size. The respirophasic diameter changes were in the normal range (= 50%), consistent with normal central venous pressure.  ------------------------------------------------------------------- Post procedure  conclusions Ascending Aorta:  - The aorta was normal, not dilated, and non-diseased.  ------------------------------------------------------------------- Measurements  Left ventricle                          Value          Reference LV ID, ED, PLAX chordal         (H)     55.4  mm       43 - 52 LV ID, ES, PLAX chordal         (H)     39.2  mm       23 - 38 LV fx shortening, PLAX chordal          29    %        >=29 LV PW thickness, ED                     13.1  mm       ---------- IVS/LV PW ratio, ED                     0.87           <=1.3 Stroke volume, 2D                       58    ml       ---------- Stroke volume/bsa, 2D                   28    ml/m^2   ---------- LV e&', lateral                          11.9  cm/s     ---------- LV E/e&', lateral                        5.03           ---------- LV e&', medial  5.98  cm/s     ---------- LV E/e&', medial                         10.02          ---------- LV e&', average                          8.94  cm/s     ---------- LV E/e&', average                        6.7            ----------  Ventricular septum                      Value          Reference IVS thickness, ED                       11.4  mm       ----------  LVOT                                    Value          Reference LVOT ID, S                              21    mm       ---------- LVOT area                               3.46  cm^2     ---------- LVOT peak velocity, S                   85.3  cm/s     ---------- LVOT mean velocity, S                   63.8  cm/s     ---------- LVOT VTI, S                             16.8  cm       ----------  Aorta                                   Value          Reference Aortic root ID, ED                      34    mm       ---------- Ascending aorta ID, A-P, S              34    mm       ----------  Left atrium                             Value          Reference LA ID, A-P, ES  31    mm       ---------- LA ID/bsa, A-P                          1.49  cm/m^2   <=2.2 LA volume, S                            60.8  ml       ---------- LA volume/bsa, S                        29.3  ml/m^2   ---------- LA volume, ES, 1-p A4C                  51.9  ml       ---------- LA volume/bsa, ES, 1-p A4C              25    ml/m^2   ---------- LA volume, ES, 1-p A2C                  67.7  ml       ---------- LA volume/bsa, ES, 1-p A2C              32.6  ml/m^2   ----------  Mitral valve                            Value          Reference Mitral E-wave peak velocity             59.9  cm/s     ---------- Mitral A-wave peak velocity             77.5  cm/s     ---------- Mitral deceleration time        (H)     292   ms       150 - 230 Mitral pressure half-time               86    ms       ---------- Mitral E/A ratio, peak                  0.8            ---------- Mitral valve area, PHT, DP              2.56  cm^2     ---------- Mitral valve area/bsa, PHT, DP          1.23  cm^2/m^2 ----------  Pulmonary arteries                      Value          Reference PA pressure, S, DP                      14    mm Hg    <=30  Tricuspid valve                         Value          Reference Tricuspid regurg peak velocity          164   cm/s     ---------- Tricuspid peak RV-RA gradient  11    mm Hg    ----------  Right atrium                            Value          Reference RA ID, S-I, ES, A4C             (H)     52.2  mm       34 - 49 RA area, ES, A4C                        13.6  cm^2     8.3 - 19.5 RA volume, ES, A/L                      29.3  ml       ---------- RA volume/bsa, ES, A/L                  14.1  ml/m^2   ----------  Systemic veins                          Value          Reference Estimated CVP                           3     mm Hg    ----------  Right ventricle                         Value          Reference RV ID, minor axis, ED, A4C base         34.1  mm        ---------- TAPSE                                   19.8  mm       ---------- RV pressure, S, DP                      14    mm Hg    <=30 RV s&', lateral, S                       13.3  cm/s     ----------  Legend: (L)  and  (H)  mark values outside specified reference range.  ------------------------------------------------------------------- Prepared and Electronically Authenticated by  Norman Herrlich, MD 2019-11-29T15:51:45    CT SCANS  CT CORONARY MORPH W/CTA COR W/SCORE 02/08/2021  Addendum 02/08/2021 10:32 AM ADDENDUM REPORT: 02/08/2021 10:30  CLINICAL DATA:  Chest pain  EXAM: Cardiac CTA  MEDICATIONS: Sub lingual nitro. 4 mg and lopressor 100mg   TECHNIQUE: The patient was scanned on a [employer] 192 scanner. Gantry rotation speed was 250 msecs. Collimation was. 6 mm . A 120 kV prospective scan was triggered in the ascending thoracic aorta at 140 HU's with full mA between 30-70% of the R-R interval . Average HR during the scan was 60 bpm. The 3D data set was interpreted on a dedicated work station using MPR, MIP and VRT modes. A total of 80 cc of contrast was used.  FINDINGS: Non-cardiac: See separate report from [facility]. No  significant findings on limited lung and soft tissue windows.  Calcium score: No calcium noted  Coronary Arteries: Right dominant with no anomalies  LM: Normal  LAD: Normal  D1: Normal  D2: Normal  D3: Normal  Circumflex: Normal  OM1: Normal  OM2: Normal  RCA: Normal  PDA: Normal  PLA: Normal  IMPRESSION: 1. Calcium Score 0  2.  Normal ascending thoracic aorta 3.3 cm  3.  Normal right dominant coronary arteries CAD RADS 0  Charlton Haws   Electronically Signed By: Charlton Haws M.D. On: 02/08/2021 10:30  Narrative EXAM: OVER-READ INTERPRETATION  CT CHEST  The following report is an over-read performed by radiologist Dr. Trudie Reed of [facility] on 02/08/2021. This over-read  does not include interpretation of cardiac or coronary anatomy or pathology. The coronary calcium score/coronary CTA interpretation by the cardiologist is attached.  COMPARISON:  None.  FINDINGS: Atherosclerotic calcifications in the thoracic aorta. Within the visualized portions of the thorax there are no suspicious appearing pulmonary nodules or masses, there is no acute consolidative airspace disease, no pleural effusions, no pneumothorax and no lymphadenopathy. Visualized portions of the upper abdomen are unremarkable. There are no aggressive appearing lytic or blastic lesions noted in the visualized portions of the skeleton.  IMPRESSION: 1.  Aortic Atherosclerosis (ICD10-I70.0).  Electronically Signed: By: Trudie Reed M.D. On: 02/08/2021 09:47           Current Reported Medications:.    Current Meds  Medication Sig   amitriptyline (ELAVIL) 50 MG tablet Take 50 mg by mouth at bedtime.   benazepril (LOTENSIN) 20 MG tablet Take 20 mg by mouth daily.   levothyroxine (SYNTHROID) 175 MCG tablet Take 175 mcg by mouth daily.   rosuvastatin (CRESTOR) 10 MG tablet Take 10 mg by mouth daily.   Physical Exam:   VS:  BP 124/82   Pulse 66   Ht 5\' 8"  (1.727 m)   Wt 212 lb 3.2 oz (96.3 kg)   SpO2 96%   BMI 32.26 kg/m    Wt Readings from Last 3 Encounters:  04/02/23 212 lb 3.2 oz (96.3 kg)  10/19/22 195 lb (88.5 kg)  07/13/21 185 lb 12.8 oz (84.3 kg)    GEN: Well nourished, well developed in no acute distress NECK: No JVD; No carotid bruits CARDIAC: RRR, no murmurs, rubs, gallops RESPIRATORY:  Clear to auscultation without rales, wheezing or rhonchi  ABDOMEN: Soft, non-tender, non-distended EXTREMITIES:  No edema; No acute deformity  Asessement and Plan:.    SVT/PVCs/PACs: Patient with history of SVT in August 2024, he was given adenosine and 1 L of fluid with resolution.  Patient notes that last summer he had a few episodes of some fast heart rates that quickly  resolved on their own.  Presented today for PVCs/PACs noted on his Kardia mobile.  Patient denies any palpitations or feeling of fast heart rates recently.  Discussed cardiac monitor, he deferred.  His EKG today indicates sinus rhythm.  Discussed potential causes such as dehydration, anxiety or increased caffeine intake.  He also reports that his TSH has been low, denies any recent adjustments in medication, being managed by an endocrinologist.  He also notes that he was previously on an increased dose of amitriptyline but this caused increased restlessness, he has since decreased dosage.  Patient plans to increase hydration and decrease his caffeine intake and he will continue to monitor for symptoms.  HTN: Initial blood pressure today 134/94, on recheck was 124/82. Continue benazepril 20  mg daily.  Managed per PCP.  HLD: Last lipid profile on 04/24/2022 indicated total cholesterol 194, HDL 58, triglycerides 95 and LDL 119.  Now on Crestor 10 mg daily.  Monitored and managed per PCP.   Disposition: F/u with Dr. Duke Salvia in one year (pt request) or sooner if needed.   Signed, Rip Harbour, NP

## 2023-04-02 ENCOUNTER — Encounter: Payer: Self-pay | Admitting: Cardiology

## 2023-04-02 ENCOUNTER — Ambulatory Visit: Payer: 59 | Attending: Cardiology | Admitting: Cardiology

## 2023-04-02 VITALS — BP 124/82 | HR 66 | Ht 68.0 in | Wt 212.2 lb

## 2023-04-02 DIAGNOSIS — R002 Palpitations: Secondary | ICD-10-CM | POA: Diagnosis not present

## 2023-04-02 DIAGNOSIS — I491 Atrial premature depolarization: Secondary | ICD-10-CM | POA: Diagnosis not present

## 2023-04-02 DIAGNOSIS — I471 Supraventricular tachycardia, unspecified: Secondary | ICD-10-CM

## 2023-04-02 DIAGNOSIS — I493 Ventricular premature depolarization: Secondary | ICD-10-CM

## 2023-04-02 NOTE — Patient Instructions (Signed)
Medication Instructions:  No changes *If you need a refill on your cardiac medications before your next appointment, please call your pharmacy*  Lab Work: No labs  Testing/Procedures: No testing  Follow-Up: At [facility], you and your health needs are our priority.  As part of our continuing mission to provide you with exceptional heart care, we have created designated Provider Care Teams.  These Care Teams include your primary Cardiologist (physician) and Advanced Practice Providers (APPs -  Physician Assistants and Nurse Practitioners) who all work together to provide you with the care you need, when you need it.  We recommend signing up for the patient portal called "MyChart".  Sign up information is provided on this After Visit Summary.  MyChart is used to connect with patients for Virtual Visits (Telemedicine).  Patients are able to view lab/test results, encounter notes, upcoming appointments, etc.  Non-urgent messages can be sent to your provider as well.   To learn more about what you can do with MyChart, go to [email or web address].    Your next appointment:   1 year(s)  Provider:   Chilton Si, MD

## 2023-05-19 ENCOUNTER — Emergency Department (HOSPITAL_COMMUNITY)
Admission: EM | Admit: 2023-05-19 | Discharge: 2023-05-19 | Disposition: A | Discharge status: Home / Self Care | Attending: Emergency Medicine | Admitting: Emergency Medicine

## 2023-05-19 ENCOUNTER — Other Ambulatory Visit: Payer: Self-pay

## 2023-05-19 ENCOUNTER — Encounter (HOSPITAL_COMMUNITY): Payer: Self-pay | Admitting: Emergency Medicine

## 2023-05-19 DIAGNOSIS — R000 Tachycardia, unspecified: Secondary | ICD-10-CM | POA: Diagnosis present

## 2023-05-19 DIAGNOSIS — I471 Supraventricular tachycardia, unspecified: Secondary | ICD-10-CM | POA: Insufficient documentation

## 2023-05-19 DIAGNOSIS — I10 Essential (primary) hypertension: Secondary | ICD-10-CM | POA: Insufficient documentation

## 2023-05-19 DIAGNOSIS — Z79899 Other long term (current) drug therapy: Secondary | ICD-10-CM | POA: Insufficient documentation

## 2023-05-19 LAB — BASIC METABOLIC PANEL
Anion gap: 9 (ref 5–15)
BUN: 23 mg/dL — ABNORMAL HIGH (ref 6–20)
CO2: 22 mmol/L (ref 22–32)
Calcium: 8.3 mg/dL — ABNORMAL LOW (ref 8.9–10.3)
Chloride: 108 mmol/L (ref 98–111)
Creatinine, Ser: 1.03 mg/dL (ref 0.61–1.24)
GFR, Estimated: >60 mL/min (ref >60)
Glucose, Bld: 95 mg/dL (ref 70–99)
Potassium: 3.9 mmol/L (ref 3.5–5.1)
Sodium: 139 mmol/L (ref 135–145)

## 2023-05-19 LAB — CBC WITH DIFFERENTIAL/PLATELET
Abs Immature Granulocytes: 0.05 10*3/uL (ref 0.00–0.07)
Basophils Absolute: 0.1 10*3/uL (ref 0.0–0.1)
Basophils Relative: 1 %
Eosinophils Absolute: 0.2 10*3/uL (ref 0.0–0.5)
Eosinophils Relative: 3 %
HCT: 45.2 % (ref 39.0–52.0)
Hemoglobin: 14.7 g/dL (ref 13.0–17.0)
Immature Granulocytes: 1 %
Lymphocytes Relative: 33 %
Lymphs Abs: 2.9 10*3/uL (ref 0.7–4.0)
MCH: 28.3 pg (ref 26.0–34.0)
MCHC: 32.5 g/dL (ref 30.0–36.0)
MCV: 86.9 fL (ref 80.0–100.0)
Monocytes Absolute: 0.6 10*3/uL (ref 0.1–1.0)
Monocytes Relative: 7 %
Neutro Abs: 5 10*3/uL (ref 1.7–7.7)
Neutrophils Relative %: 55 %
Platelets: 218 10*3/uL (ref 150–400)
RBC: 5.2 MIL/uL (ref 4.22–5.81)
RDW: 13 % (ref 11.5–15.5)
WBC: 8.9 10*3/uL (ref 4.0–10.5)
nRBC: 0 % (ref 0.0–0.2)

## 2023-05-19 MED ORDER — SODIUM CHLORIDE 0.9 % IV BOLUS
500.0000 mL | Freq: Once | INTRAVENOUS | Status: AC
  Administered 2023-05-19: 500 mL via INTRAVENOUS

## 2023-05-19 NOTE — ED Provider Notes (Signed)
 [place] EMERGENCY DEPARTMENT AT [facility] Provider Note  CSN: [id number] Arrival date & time: 05/19/23 8295  Chief Complaint(s) Tachycardia  HPI Alex Mendoza is a 53 y.o. male with h/o pSVT, hypothyroidism here for episode of SVT that began 2 hours prior to arrival.  When episode did not seize as usual, he called EMS.  They attempted vagal maneuvers and IV fluids but were unsuccessful.  Patient was given 6mg  of adenosine and converted to normal sinus rhythm.  Patient denies any associated chest pain or shortness of breath.  He did feel lightheaded towards the end of the SVT episode.  He denies any recent fevers or infections.  No coughing or congestion.  No nausea vomiting.  No other physical complaints  The history is provided by the patient and the EMS personnel.    Past Medical History Past Medical History:  Diagnosis Date   Bradycardia 07/20/2015   Hypercholesteremia    Hypertension    Lymphoma, diffuse (HCC)    Palpitations 07/20/2015   Patient Active Problem List   Diagnosis Date Noted   Port-A-Cath in place 01/09/2018   Counseling regarding advance care planning and goals of care 12/09/2017   Diffuse large B-cell lymphoma of lymph nodes of multiple regions (HCC) 11/27/2017   Pain in left elbow 03/08/2016   Bradycardia 07/20/2015   Palpitations 07/20/2015   Home Medication(s) Prior to Admission medications   Medication Sig Start Date End Date Taking? Authorizing Provider  amitriptyline (ELAVIL) 50 MG tablet Take 50 mg by mouth at bedtime. 01/25/20   [provider]  benazepril (LOTENSIN) 20 MG tablet Take 20 mg by mouth daily. 06/17/22   [provider]  levothyroxine (SYNTHROID) 175 MCG tablet Take 175 mcg by mouth daily. 01/01/21   [provider]  metoprolol tartrate (LOPRESSOR) 100 MG tablet Please take 2 hours prior to Cardiac CTA Patient not taking: Reported on 04/02/2023 01/25/21   Alver Sorrow, NP  rosuvastatin  (CRESTOR) 10 MG tablet Take 10 mg by mouth daily. 12/30/20   [provider]                                                                                                                                    Allergies Sulfa antibiotics  Review of Systems Review of Systems As noted in HPI  Physical Exam Vital Signs  I have reviewed the triage vital signs BP 105/68   Pulse 65   Temp 98.4 F (36.9 C) (Oral)   Resp (!) 21   Ht 5\' 8"  (1.727 m)   Wt 95.3 kg   SpO2 92%   BMI 31.93 kg/m   Physical Exam Vitals reviewed.  Constitutional:      General: He is not in acute distress.    Appearance: He is well-developed. He is not diaphoretic.  HENT:     Head: Normocephalic and atraumatic.     Nose: Nose normal.  Eyes:     General: No scleral icterus.       Right eye: No discharge.        Left eye: No discharge.     Conjunctiva/sclera: Conjunctivae normal.     Pupils: Pupils are equal, round, and reactive to light.  Cardiovascular:     Rate and Rhythm: Normal rate. Rhythm regularly irregular.     Heart sounds: No murmur heard.    No friction rub. No gallop.  Pulmonary:     Effort: Pulmonary effort is normal. No respiratory distress.     Breath sounds: Normal breath sounds. No stridor. No rales.  Abdominal:     General: There is no distension.     Palpations: Abdomen is soft.     Tenderness: There is no abdominal tenderness.  Musculoskeletal:        General: No tenderness.     Cervical back: Normal range of motion and neck supple.  Skin:    General: Skin is warm and dry.     Findings: No erythema or rash.  Neurological:     Mental Status: He is alert and oriented to person, place, and time.     ED Results and Treatments Labs (all labs ordered are listed, but only abnormal results are displayed) Labs Reviewed  BASIC METABOLIC PANEL - Abnormal; Notable for the following components:      Result Value   BUN 23 (*)    Calcium 8.3 (*)    All other components  within normal limits  CBC WITH DIFFERENTIAL/PLATELET                                                                                                                         EKG  EKG Interpretation Date/Time:  Monday May 19 2023 04:01:24 EDT Ventricular Rate:  74 PR Interval:  171 QRS Duration:  99 QT Interval:  392 QTC Calculation: 435 R Axis:   -7  Text Interpretation: Sinus rhythm Ventricular premature complex Low voltage, precordial leads Anteroseptal infarct, old Confirmed by Drema Pry [phone]) on 05/19/2023 4:30:24 AM       Radiology No results found.  Medications Ordered in ED Medications  sodium chloride 0.9 % bolus 500 mL (0 mLs Intravenous Stopped 05/19/23 0610)   Procedures Procedures  (including critical care time) Medical Decision Making / ED Course   Medical Decision Making Amount and/or Complexity of Data Reviewed Labs: ordered.    Episode of SVT called by EMS and converted with 6 mg of adenosine. Labs obtained to rule out electrolyte derangements, renal sufficiency or severe anemia, to trigger the episode and these were reassuring.  He was provided with IV fluids and monitored for 2 hours without recurrence.    Final Clinical Impression(s) / ED Diagnoses Final diagnoses:  SVT (supraventricular tachycardia) (HCC)   The patient appears reasonably screened and/or stabilized for discharge and I doubt any other medical condition or other [facility] requiring further screening, evaluation, or treatment in the ED at  this time. I have discussed the findings, Dx and Tx plan with the patient/family who expressed understanding and agree(s) with the plan. Discharge instructions discussed at length. The patient/family was given strict return precautions who verbalized understanding of the instructions. No further questions at time of discharge.  Disposition: Discharge  Condition: Good  ED Discharge Orders     None        Follow Up: Joycelyn Rua, MD [address] [place] [address] [phone]  Call  to schedule an appointment for close follow up  Chilton Si, MD [address] [place] [address] [phone]  Call  to schedule an appointment for close follow up    This chart was dictated using voice recognition software.  Despite best efforts to proofread,  errors can occur which can change the documentation meaning.    Nira Conn, MD 05/19/23 [phone]

## 2023-05-19 NOTE — ED Triage Notes (Signed)
 Patient bib [facility] from home for SVT. Patient was in SVT for 2 hours with associated dizziness and weakness. EMS found patient to be in SVT with rate in the 180's. 6mg  adensoine given by ems. Patietn converted to NSR with rate in the 70's. 18G LAC.

## 2023-07-14 ENCOUNTER — Other Ambulatory Visit: Payer: Self-pay | Admitting: *Deleted

## 2023-07-14 DIAGNOSIS — C8338 Diffuse large B-cell lymphoma, lymph nodes of multiple sites: Secondary | ICD-10-CM

## 2023-07-14 DIAGNOSIS — C73 Malignant neoplasm of thyroid gland: Secondary | ICD-10-CM

## 2023-07-15 ENCOUNTER — Inpatient Hospital Stay (HOSPITAL_BASED_OUTPATIENT_CLINIC_OR_DEPARTMENT_OTHER): Payer: 59 | Admitting: Hematology & Oncology

## 2023-07-15 ENCOUNTER — Encounter: Payer: Self-pay | Admitting: Hematology & Oncology

## 2023-07-15 ENCOUNTER — Other Ambulatory Visit: Payer: Self-pay

## 2023-07-15 ENCOUNTER — Inpatient Hospital Stay: Payer: 59 | Attending: Hematology & Oncology

## 2023-07-15 VITALS — BP 113/87 | HR 69 | Temp 98.4°F | Resp 16 | Ht 66.75 in | Wt 207.0 lb

## 2023-07-15 DIAGNOSIS — C73 Malignant neoplasm of thyroid gland: Secondary | ICD-10-CM

## 2023-07-15 DIAGNOSIS — C8338 Diffuse large B-cell lymphoma, lymph nodes of multiple sites: Secondary | ICD-10-CM

## 2023-07-15 DIAGNOSIS — Z79890 Hormone replacement therapy (postmenopausal): Secondary | ICD-10-CM | POA: Diagnosis not present

## 2023-07-15 DIAGNOSIS — R519 Headache, unspecified: Secondary | ICD-10-CM | POA: Diagnosis not present

## 2023-07-15 DIAGNOSIS — Z85850 Personal history of malignant neoplasm of thyroid: Secondary | ICD-10-CM | POA: Diagnosis present

## 2023-07-15 DIAGNOSIS — Z8572 Personal history of non-Hodgkin lymphomas: Secondary | ICD-10-CM | POA: Diagnosis present

## 2023-07-15 DIAGNOSIS — R002 Palpitations: Secondary | ICD-10-CM

## 2023-07-15 DIAGNOSIS — I471 Supraventricular tachycardia, unspecified: Secondary | ICD-10-CM | POA: Insufficient documentation

## 2023-07-15 DIAGNOSIS — R001 Bradycardia, unspecified: Secondary | ICD-10-CM

## 2023-07-15 DIAGNOSIS — Z79899 Other long term (current) drug therapy: Secondary | ICD-10-CM | POA: Insufficient documentation

## 2023-07-15 DIAGNOSIS — Z9221 Personal history of antineoplastic chemotherapy: Secondary | ICD-10-CM | POA: Diagnosis not present

## 2023-07-15 DIAGNOSIS — E890 Postprocedural hypothyroidism: Secondary | ICD-10-CM | POA: Insufficient documentation

## 2023-07-15 LAB — CBC WITH DIFFERENTIAL (CANCER CENTER ONLY)
Abs Immature Granulocytes: 0.03 10*3/uL (ref 0.00–0.07)
Basophils Absolute: 0 10*3/uL (ref 0.0–0.1)
Basophils Relative: 1 %
Eosinophils Absolute: 0.2 10*3/uL (ref 0.0–0.5)
Eosinophils Relative: 2 %
HCT: 46.9 % (ref 39.0–52.0)
Hemoglobin: 15.6 g/dL (ref 13.0–17.0)
Immature Granulocytes: 0 %
Lymphocytes Relative: 29 %
Lymphs Abs: 2 10*3/uL (ref 0.7–4.0)
MCH: 28.7 pg (ref 26.0–34.0)
MCHC: 33.3 g/dL (ref 30.0–36.0)
MCV: 86.2 fL (ref 80.0–100.0)
Monocytes Absolute: 0.5 10*3/uL (ref 0.1–1.0)
Monocytes Relative: 7 %
Neutro Abs: 4.1 10*3/uL (ref 1.7–7.7)
Neutrophils Relative %: 61 %
Platelet Count: 210 10*3/uL (ref 150–400)
RBC: 5.44 MIL/uL (ref 4.22–5.81)
RDW: 13.3 % (ref 11.5–15.5)
WBC Count: 6.8 10*3/uL (ref 4.0–10.5)
nRBC: 0 % (ref 0.0–0.2)

## 2023-07-15 LAB — TSH: TSH: 0.253 u[IU]/mL — ABNORMAL LOW (ref 0.350–4.500)

## 2023-07-15 LAB — COMPREHENSIVE METABOLIC PANEL WITH GFR
ALT: 23 U/L (ref 0–44)
AST: 21 U/L (ref 15–41)
Albumin: 4.5 g/dL (ref 3.5–5.0)
Alkaline Phosphatase: 62 U/L (ref 38–126)
Anion gap: 7 (ref 5–15)
BUN: 22 mg/dL — ABNORMAL HIGH (ref 6–20)
CO2: 30 mmol/L (ref 22–32)
Calcium: 9.1 mg/dL (ref 8.9–10.3)
Chloride: 103 mmol/L (ref 98–111)
Creatinine, Ser: 1.12 mg/dL (ref 0.61–1.24)
GFR, Estimated: >60 mL/min (ref >60)
Glucose, Bld: 100 mg/dL — ABNORMAL HIGH (ref 70–99)
Potassium: 5 mmol/L (ref 3.5–5.1)
Sodium: 140 mmol/L (ref 135–145)
Total Bilirubin: 0.7 mg/dL (ref 0.0–1.2)
Total Protein: 7.1 g/dL (ref 6.5–8.1)

## 2023-07-15 LAB — LACTATE DEHYDROGENASE: LDH: 179 U/L (ref 98–192)

## 2023-07-15 NOTE — Progress Notes (Signed)
 Hematology and Oncology Follow Up Visit  Alex Mendoza [id number] 03-Jul-1970 53 y.o. 07/15/2023   Principle Diagnosis:  Diffuse large cell non-Hodgkin's lymphoma Papillary thyroid  cancer  Current Therapy:   Status post R-CHOP-completed 6 cycles in January  2020 Status post thyroidectomy and radioactive iodine     Interim History:  Alex Mendoza is back for follow-up.  We see him once a year.  The problem right now is that has been having SVT episodes.  They are not that often.  He actually is going to the ER because of this.  He has seen cardiology.  They do not have him on any medications for this.  He also has been noting some headaches.  This over the past 3 months.  They do not wake him up at nighttime.  They seem to be worse in the morning.  They are not associated with visual changes.  He has had no weakness on one side or the other.  He has had no nausea or vomiting with him.  There is no tinnitus.  He is still bike riding.  He is quite vigorous I doing this.  He and his family had a nice vacation last year.  [place] to [place] [facility].  He is still working.  He is quite busy at work.  Thankfully, he has not had any problems with COVID or Influenza.  He has had no tingling in the hands or feet.  He has had no leg swelling.  He has had no rashes.  There is been no bleeding.  Overall, I would have said that his performance status is probably ECOG 0.    Medications:  Current Outpatient Medications:    amitriptyline (ELAVIL) 25 MG tablet, Take 25 mg by mouth at bedtime., Disp: , Rfl:    benazepril (LOTENSIN) 20 MG tablet, Take 20 mg by mouth daily., Disp: , Rfl:    levothyroxine (SYNTHROID) 175 MCG tablet, Take 175 mcg by mouth daily., Disp: , Rfl:    rosuvastatin (CRESTOR) 10 MG tablet, Take 10 mg by mouth daily., Disp: , Rfl:  No current facility-administered medications for this visit.  Facility-Administered Medications Ordered in Other Visits:     gadopentetate dimeglumine  (MAGNEVIST ) injection 18 mL, 18 mL, Intravenous, Once PRN, Penumalli, Vikram R, MD  Allergies:  Allergies  Allergen Reactions   Sulfa Antibiotics Hives and Rash    Past Medical History, Surgical history, Social history, and Family History were reviewed and updated.  Review of Systems: Review of Systems  Constitutional: Negative.   HENT:  Negative.    Eyes: Negative.   Respiratory: Negative.    Cardiovascular: Negative.   Gastrointestinal: Negative.   Endocrine: Negative.   Genitourinary: Negative.    Musculoskeletal: Negative.   Skin: Negative.   Neurological: Negative.   Hematological: Negative.   Psychiatric/Behavioral: Negative.      Physical Exam: Vital signs show temperature of 98.4.  Pulse 69.  Blood pressure 113/87.  Weight is 207 pounds.    Wt Readings from Last 3 Encounters:  07/15/23 207 lb (93.9 kg)  05/19/23 210 lb (95.3 kg)  04/02/23 212 lb 3.2 oz (96.3 kg)    Physical Exam Vitals reviewed.  HENT:     Head: Normocephalic and atraumatic.  Eyes:     Pupils: Pupils are equal, round, and reactive to light.  Cardiovascular:     Rate and Rhythm: Normal rate and regular rhythm.     Heart sounds: Normal heart sounds.  Pulmonary:  Effort: Pulmonary effort is normal.     Breath sounds: Normal breath sounds.  Abdominal:     General: Bowel sounds are normal.     Palpations: Abdomen is soft.  Musculoskeletal:        General: No tenderness or deformity. Normal range of motion.     Cervical back: Normal range of motion.  Lymphadenopathy:     Cervical: No cervical adenopathy.  Skin:    General: Skin is warm and dry.     Findings: No erythema or rash.  Neurological:     Mental Status: He is alert and oriented to person, place, and time.  Psychiatric:        Behavior: Behavior normal.        Thought Content: Thought content normal.        Judgment: Judgment normal.      Lab Results  Component Value Date   WBC 6.8  07/15/2023   HGB 15.6 07/15/2023   HCT 46.9 07/15/2023   MCV 86.2 07/15/2023   PLT 210 07/15/2023     Chemistry      Component Value Date/Time   NA 140 07/15/2023 1208   K 5.0 07/15/2023 1208   CL 103 07/15/2023 1208   CO2 30 07/15/2023 1208   BUN 22 (H) 07/15/2023 1208   CREATININE 1.12 07/15/2023 1208   CREATININE 1.21 08/06/2022 1055      Component Value Date/Time   CALCIUM 9.1 07/15/2023 1208   ALKPHOS 62 07/15/2023 1208   AST 21 07/15/2023 1208   AST 25 08/06/2022 1055   ALT 23 07/15/2023 1208   ALT 26 08/06/2022 1055   BILITOT 0.7 07/15/2023 1208   BILITOT 0.5 08/06/2022 1055      Impression and Plan: Alex Mendoza is a very nice 53 year old white male.  He has 2 separate malignancies.  He had the large cell lymphoma treated with chemotherapy.  I am sure that this is in remission and he is probably cured from this.  He also has thyroid  cancer.  He was treated with thyroidectomy and radioactive iodine.  I would had to believe that this is also cured.  I am not sure what to make of the headaches.  There is no neurological findings on exam.  The headaches certainly do not appear to be all that bad.  However, I told him that if the headaches continue, to let me know and I will have to see about getting an MRI of the brain.  I am sure that cardiology is managing the SVT issue.  It sounds like they do not feel that he needs to have much done right now.  We will still follow him up in 1 year.   Ivor Mars, MD 5/6/20251:31 PM

## 2024-01-22 ENCOUNTER — Other Ambulatory Visit: Payer: Self-pay

## 2024-01-22 ENCOUNTER — Emergency Department (HOSPITAL_COMMUNITY)
Admission: EM | Admit: 2024-01-22 | Discharge: 2024-01-22 | Disposition: A | Discharge status: Home / Self Care | Attending: Emergency Medicine | Admitting: Emergency Medicine

## 2024-01-22 ENCOUNTER — Encounter (HOSPITAL_COMMUNITY): Payer: Self-pay | Admitting: *Deleted

## 2024-01-22 DIAGNOSIS — R002 Palpitations: Secondary | ICD-10-CM | POA: Diagnosis present

## 2024-01-22 DIAGNOSIS — I471 Supraventricular tachycardia, unspecified: Secondary | ICD-10-CM | POA: Diagnosis not present

## 2024-01-22 DIAGNOSIS — Z79899 Other long term (current) drug therapy: Secondary | ICD-10-CM | POA: Diagnosis not present

## 2024-01-22 DIAGNOSIS — I10 Essential (primary) hypertension: Secondary | ICD-10-CM | POA: Diagnosis not present

## 2024-01-22 HISTORY — DX: Supraventricular tachycardia, unspecified: I47.10

## 2024-01-22 LAB — CBC WITH DIFFERENTIAL/PLATELET
Abs Immature Granulocytes: 0.04 K/uL (ref 0.00–0.07)
Basophils Absolute: 0.1 K/uL (ref 0.0–0.1)
Basophils Relative: 1 %
Eosinophils Absolute: 0.2 K/uL (ref 0.0–0.5)
Eosinophils Relative: 3 %
HCT: 45.9 % (ref 39.0–52.0)
Hemoglobin: 15.1 g/dL (ref 13.0–17.0)
Immature Granulocytes: 1 %
Lymphocytes Relative: 35 %
Lymphs Abs: 2.8 K/uL (ref 0.7–4.0)
MCH: 28.5 pg (ref 26.0–34.0)
MCHC: 32.9 g/dL (ref 30.0–36.0)
MCV: 86.8 fL (ref 80.0–100.0)
Monocytes Absolute: 0.8 K/uL (ref 0.1–1.0)
Monocytes Relative: 9 %
Neutro Abs: 4.2 K/uL (ref 1.7–7.7)
Neutrophils Relative %: 51 %
Platelets: 210 K/uL (ref 150–400)
RBC: 5.29 MIL/uL (ref 4.22–5.81)
RDW: 12.9 % (ref 11.5–15.5)
WBC: 8.1 K/uL (ref 4.0–10.5)
nRBC: 0 % (ref 0.0–0.2)

## 2024-01-22 LAB — BASIC METABOLIC PANEL WITH GFR
Anion gap: 9 (ref 5–15)
BUN: 26 mg/dL — ABNORMAL HIGH (ref 6–20)
CO2: 25 mmol/L (ref 22–32)
Calcium: 8.1 mg/dL — ABNORMAL LOW (ref 8.9–10.3)
Chloride: 105 mmol/L (ref 98–111)
Creatinine, Ser: 1.05 mg/dL (ref 0.61–1.24)
GFR, Estimated: >60 mL/min (ref >60)
Glucose, Bld: 83 mg/dL (ref 70–99)
Potassium: 4.4 mmol/L (ref 3.5–5.1)
Sodium: 139 mmol/L (ref 135–145)

## 2024-01-22 LAB — MAGNESIUM: Magnesium: 2 mg/dL (ref 1.7–2.4)

## 2024-01-22 LAB — TSH: TSH: <0.1 u[IU]/mL — ABNORMAL LOW (ref 0.350–4.500)

## 2024-01-22 NOTE — Discharge Instructions (Addendum)
 Labs today were all reassuring. TSH remains low, follow up with your endocrinologist regarding this. Continue medications as prescribed. Follow-up with your cardiologist. Return here for new concerns.

## 2024-01-22 NOTE — ED Triage Notes (Signed)
 Pt arrives from home via [facility], per their report, having SVT 1.5 hours. HR 200.  IV established in the left [facility]. rec'd adenosine 6 and adenosine 12 en route, with conversion to sinus arrythmia with PVC's. Hr 80's, 102/58, 1000 ns given IV,  97R, rr 16, no pain, Hx of SVT- has been having episodes more frequently this year.

## 2024-01-22 NOTE — ED Provider Notes (Signed)
 [place] EMERGENCY DEPARTMENT AT [facility] Provider Note   CSN: [id number] Arrival date & time: 01/22/24  0518     Patient presents with: Palpitations   Alex Mendoza is a 53 y.o. male.   The history is provided by the patient and medical records.  Palpitations  53 y.o. M with hx of HTN, hx of SVT, hx of lymphoma currently in remission, presenting to the ED after episode of SVT.  States was ongoing about 1.5 hours prior to calling EMS.  Sometimes he is able to get himself out of it but persisted tonight.  He was not having any chest pain, shortness of breath.  He was given adenosine 6 mg and 12 mg dosing with EMS and converted to sinus rhythm.  He is currently asymptomatic.  States he has had SVT before, this is not anything different for him.  Episodes are usually triggered by stress.  Does admit he had a stressful day yesterday and did not sleep last night as he was ruminating on something a coworker said to him yesterday. Works as a sport and exercise psychologist.  He denies any medication changes.    Prior to Admission medications   Medication Sig Start Date End Date Taking? Authorizing Provider  amitriptyline (ELAVIL) 25 MG tablet Take 25 mg by mouth at bedtime.    [provider]  benazepril (LOTENSIN) 20 MG tablet Take 20 mg by mouth daily. 06/17/22   [provider]  levothyroxine (SYNTHROID) 175 MCG tablet Take 175 mcg by mouth daily. 01/01/21   [provider]  rosuvastatin (CRESTOR) 10 MG tablet Take 10 mg by mouth daily. 12/30/20   [provider]    Allergies: Sulfa antibiotics    Review of Systems  Cardiovascular:  Positive for palpitations.  All other systems reviewed and are negative.   Updated Vital Signs BP 108/71 (BP Location: Right Arm)   Pulse 72   Temp 98 F (36.7 C)   Resp 16   SpO2 100%   Physical Exam Vitals and nursing note reviewed.  Constitutional:      Appearance: He is well-developed.  HENT:     Head:  Normocephalic and atraumatic.  Eyes:     Conjunctiva/sclera: Conjunctivae normal.     Pupils: Pupils are equal, round, and reactive to light.  Cardiovascular:     Rate and Rhythm: Normal rate and regular rhythm.     Heart sounds: Normal heart sounds.  Pulmonary:     Effort: Pulmonary effort is normal.     Breath sounds: Normal breath sounds. No stridor. No wheezing.  Abdominal:     General: Bowel sounds are normal.     Palpations: Abdomen is soft.  Musculoskeletal:        General: Normal range of motion.     Cervical back: Normal range of motion.  Skin:    General: Skin is warm and dry.  Neurological:     Mental Status: He is alert and oriented to person, place, and time.     (all labs ordered are listed, but only abnormal results are displayed) Labs Reviewed  BASIC METABOLIC PANEL WITH GFR - Abnormal; Notable for the following components:      Result Value   BUN 26 (*)    Calcium 8.1 (*)    All other components within normal limits  CBC WITH DIFFERENTIAL/PLATELET  MAGNESIUM  TSH    EKG: None  Radiology: No results found.   Procedures   Medications Ordered in the ED -  No data to display                                  Medical Decision Making Amount and/or Complexity of Data Reviewed Labs: ordered. ECG/medicine tests: ordered and independent interpretation performed.   53 year old male presenting to the ED after episode of SVT.  Converted after adenosine with EMS.  Currently asymptomatic.  Does report history of same, usually feel by stress.  Did have a stressful day yesterday and was sleeping poorly last evening.  EKG is nonischemic, normal rhythm with some PVCs-- has hx of same.  Has been up and ambulatory here in the ED, still remains asymptomatic.  No chest pain or shortness of breath.  Will plan for labs with electrolytes, magnesium, and TSH as he has a history of same and recent levels were abnormal.  Labs as above--no leukocytosis or electrolyte  derangement.  Magnesium is normal.  TSH is pending.  Care will be signed out to oncoming provider.  Will plan to observe here to ensure he remains in a sinus rhythm and follow-up on TSH.  If remaining stable, feel he will be appropriate for discharge with outpatient cardiology follow-up, he is established with Dr. Raford.  Final diagnoses:  SVT (supraventricular tachycardia)    ED Discharge Orders     None          Jarold Olam HERO, PA-C 01/22/24 9362    Lorette Mayo, MD 01/22/24 [phone]

## 2024-01-22 NOTE — ED Provider Notes (Signed)
 Care assumed from PA Olam [place] at shift change, please see their note for full detail, but in brief Alex Mendoza is a 53 y.o. male presents with SVT.  Received 6 mg and 12 mg of adenosine with EMS and then converted back to sinus rhythm.  Has been in sinus rhythm throughout his ED stay.  Feels that this was triggered after he got upset regarding something at work and was perseverating over it throughout the night.  He reports that these episodes are often triggered by stress and he has a long history of the same.  He also has a history of previous thyroidectomy on Synthroid and has recently had  low TSH levels and is followed by endocrinology.  Plan: CBC and BMP unremarkable, TSH pending, anticipate discharge home with outpatient follow-up after some additional monitoring to ensure patient remains in sinus rhythm   Physical Exam  BP 111/72   Pulse 61   Temp 98 F (36.7 C)   Resp 15   SpO2 94%   Physical Exam Vitals and nursing note reviewed.  Constitutional:      General: He is not in acute distress.    Appearance: Normal appearance. He is well-developed. He is not ill-appearing or diaphoretic.  HENT:     Head: Normocephalic and atraumatic.  Eyes:     General:        Right eye: No discharge.        Left eye: No discharge.  Cardiovascular:     Rate and Rhythm: Normal rate and regular rhythm.  Pulmonary:     Effort: Pulmonary effort is normal. No respiratory distress.  Neurological:     Mental Status: He is alert and oriented to person, place, and time.     Coordination: Coordination normal.  Psychiatric:        Mood and Affect: Mood normal.        Behavior: Behavior normal.     Procedures  Procedures  ED Course / MDM    Medical Decision Making Amount and/or Complexity of Data Reviewed Labs: ordered. ECG/medicine tests: ordered.   Patient has remained in sinus rhythm throughout 2 hours of monitoring in the ED after receiving adenosine after an episode of SVT.  He  is alert and well-appearing and asymptomatic at this time.  Has been ambulatory in the department without tachycardia or lightheadedness.  At this time patient is appropriate for discharge home.  I have informed him that his TSH continues to be low and he will follow-up with his endocrinologist regarding this.  At this time there does not appear to be any evidence of an acute emergency medical condition requiring further emergent evaluation and the patient appears stable for discharge with appropriate outpatient follow up. Diagnosis and return precautions discussed with patient who verbalizes understanding and is agreeable to discharge.          Alva Larraine FALCON, PA-C 01/22/24 9281    Lorette Mayo, MD 01/22/24 2252

## 2024-02-18 ENCOUNTER — Encounter (HOSPITAL_BASED_OUTPATIENT_CLINIC_OR_DEPARTMENT_OTHER): Payer: Self-pay | Admitting: Cardiovascular Disease

## 2024-04-09 ENCOUNTER — Other Ambulatory Visit: Payer: Self-pay | Admitting: Endocrinology

## 2024-04-09 DIAGNOSIS — E890 Postprocedural hypothyroidism: Secondary | ICD-10-CM

## 2024-04-27 ENCOUNTER — Other Ambulatory Visit

## 2024-07-14 ENCOUNTER — Ambulatory Visit: Admitting: Hematology & Oncology

## 2024-07-14 ENCOUNTER — Inpatient Hospital Stay
